# Patient Record
Sex: Female | Born: 1985 | Race: Black or African American | Hispanic: No | Marital: Single | Smoking: Former smoker
Health system: Southern US, Community
[De-identification: ages and names within clinical notes are randomized; demographics above are authoritative.]

## PROBLEM LIST (undated history)

## (undated) DIAGNOSIS — E669 Obesity, unspecified: Secondary | ICD-10-CM

## (undated) HISTORY — PX: OTHER SURGICAL HISTORY: SHX169

## (undated) HISTORY — DX: Obesity, unspecified: E66.9

---
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
## 2007-02-09 ENCOUNTER — Ambulatory Visit: Payer: Self-pay | Admitting: Family Medicine

## 2007-03-31 ENCOUNTER — Inpatient Hospital Stay (HOSPITAL_COMMUNITY): Admission: AD | Admit: 2007-03-31 | Discharge: 2007-03-31 | Payer: Self-pay | Admitting: Obstetrics & Gynecology

## 2007-04-02 ENCOUNTER — Telehealth: Payer: Self-pay | Admitting: *Deleted

## 2007-04-02 ENCOUNTER — Ambulatory Visit: Payer: Self-pay | Admitting: Family Medicine

## 2007-04-04 ENCOUNTER — Telehealth (INDEPENDENT_AMBULATORY_CARE_PROVIDER_SITE_OTHER): Payer: Self-pay | Admitting: *Deleted

## 2007-04-04 ENCOUNTER — Telehealth: Payer: Self-pay | Admitting: Family Medicine

## 2007-04-12 ENCOUNTER — Telehealth: Payer: Self-pay | Admitting: *Deleted

## 2007-04-23 ENCOUNTER — Encounter: Payer: Self-pay | Admitting: Family Medicine

## 2007-04-23 ENCOUNTER — Ambulatory Visit: Payer: Self-pay | Admitting: Sports Medicine

## 2007-04-23 LAB — CONVERTED CEMR LAB
AST: 14 units/L (ref 0–37)
Albumin: 4.3 g/dL (ref 3.5–5.2)
Alkaline Phosphatase: 87 units/L (ref 39–117)
Chlamydia, DNA Probe: NEGATIVE
MCHC: 32.4 g/dL (ref 30.0–36.0)
MCV: 89.3 fL (ref 78.0–100.0)
Potassium: 3.7 meq/L (ref 3.5–5.3)
RDW: 13.3 % (ref 11.5–15.5)
Sodium: 141 meq/L (ref 135–145)
Total Bilirubin: 0.4 mg/dL (ref 0.3–1.2)
Total Protein: 7.9 g/dL (ref 6.0–8.3)
Whiff Test: NEGATIVE

## 2007-04-25 ENCOUNTER — Telehealth: Payer: Self-pay | Admitting: Family Medicine

## 2007-04-27 ENCOUNTER — Encounter: Payer: Self-pay | Admitting: Family Medicine

## 2007-04-30 ENCOUNTER — Telehealth: Payer: Self-pay | Admitting: Family Medicine

## 2007-05-01 ENCOUNTER — Encounter: Payer: Self-pay | Admitting: Family Medicine

## 2007-05-01 ENCOUNTER — Ambulatory Visit: Payer: Self-pay | Admitting: Family Medicine

## 2007-05-01 ENCOUNTER — Other Ambulatory Visit: Admission: RE | Admit: 2007-05-01 | Discharge: 2007-05-01 | Payer: Self-pay | Admitting: Family Medicine

## 2007-05-18 ENCOUNTER — Telehealth: Payer: Self-pay | Admitting: Family Medicine

## 2007-05-21 ENCOUNTER — Telehealth: Payer: Self-pay | Admitting: Family Medicine

## 2007-05-30 ENCOUNTER — Encounter: Payer: Self-pay | Admitting: Family Medicine

## 2007-09-18 ENCOUNTER — Encounter: Payer: Self-pay | Admitting: Family Medicine

## 2008-02-20 ENCOUNTER — Telehealth (INDEPENDENT_AMBULATORY_CARE_PROVIDER_SITE_OTHER): Payer: Self-pay | Admitting: *Deleted

## 2008-02-22 ENCOUNTER — Ambulatory Visit: Payer: Self-pay | Admitting: Family Medicine

## 2008-04-14 ENCOUNTER — Ambulatory Visit: Payer: Self-pay | Admitting: Family Medicine

## 2008-05-12 ENCOUNTER — Encounter: Payer: Self-pay | Admitting: Family Medicine

## 2008-05-12 ENCOUNTER — Ambulatory Visit: Payer: Self-pay | Admitting: Family Medicine

## 2008-05-12 ENCOUNTER — Other Ambulatory Visit: Admission: RE | Admit: 2008-05-12 | Discharge: 2008-05-12 | Payer: Self-pay | Admitting: Family Medicine

## 2008-05-12 LAB — CONVERTED CEMR LAB
Beta hcg, urine, semiquantitative: NEGATIVE
Chlamydia, DNA Probe: POSITIVE — AB
GC Probe Amp, Genital: POSITIVE — AB

## 2008-05-15 ENCOUNTER — Telehealth (INDEPENDENT_AMBULATORY_CARE_PROVIDER_SITE_OTHER): Payer: Self-pay | Admitting: *Deleted

## 2008-05-16 ENCOUNTER — Ambulatory Visit: Payer: Self-pay | Admitting: Family Medicine

## 2008-06-11 ENCOUNTER — Ambulatory Visit: Payer: Self-pay | Admitting: Family Medicine

## 2008-07-08 ENCOUNTER — Ambulatory Visit: Payer: Self-pay | Admitting: Family Medicine

## 2008-07-30 ENCOUNTER — Ambulatory Visit: Payer: Self-pay | Admitting: Family Medicine

## 2008-08-01 ENCOUNTER — Encounter: Payer: Self-pay | Admitting: Family Medicine

## 2008-08-01 ENCOUNTER — Ambulatory Visit: Payer: Self-pay | Admitting: Family Medicine

## 2008-08-01 LAB — CONVERTED CEMR LAB
BUN: 10 mg/dL (ref 6–23)
Chloride: 103 meq/L (ref 96–112)
Creatinine, Ser: 0.83 mg/dL (ref 0.40–1.20)
Potassium: 3.8 meq/L (ref 3.5–5.3)

## 2008-08-04 ENCOUNTER — Encounter: Payer: Self-pay | Admitting: Family Medicine

## 2008-09-08 ENCOUNTER — Encounter (INDEPENDENT_AMBULATORY_CARE_PROVIDER_SITE_OTHER): Payer: Self-pay | Admitting: *Deleted

## 2008-09-08 DIAGNOSIS — F17200 Nicotine dependence, unspecified, uncomplicated: Secondary | ICD-10-CM

## 2008-10-15 ENCOUNTER — Ambulatory Visit: Payer: Self-pay | Admitting: Family Medicine

## 2008-12-31 ENCOUNTER — Ambulatory Visit: Payer: Self-pay | Admitting: Family Medicine

## 2009-03-18 ENCOUNTER — Ambulatory Visit: Payer: Self-pay | Admitting: Family Medicine

## 2009-03-19 ENCOUNTER — Encounter: Payer: Self-pay | Admitting: Family Medicine

## 2009-03-20 ENCOUNTER — Encounter: Payer: Self-pay | Admitting: Family Medicine

## 2009-03-24 ENCOUNTER — Telehealth: Payer: Self-pay | Admitting: Family Medicine

## 2009-04-15 ENCOUNTER — Telehealth: Payer: Self-pay | Admitting: Family Medicine

## 2009-04-16 ENCOUNTER — Ambulatory Visit: Payer: Self-pay | Admitting: Family Medicine

## 2009-04-20 ENCOUNTER — Encounter: Payer: Self-pay | Admitting: *Deleted

## 2009-04-22 ENCOUNTER — Ambulatory Visit: Payer: Self-pay | Admitting: Family Medicine

## 2009-04-22 DIAGNOSIS — M2140 Flat foot [pes planus] (acquired), unspecified foot: Secondary | ICD-10-CM | POA: Insufficient documentation

## 2009-05-18 ENCOUNTER — Telehealth: Payer: Self-pay | Admitting: Family Medicine

## 2009-05-27 ENCOUNTER — Telehealth: Payer: Self-pay | Admitting: Family Medicine

## 2009-06-03 ENCOUNTER — Ambulatory Visit: Payer: Self-pay | Admitting: Family Medicine

## 2009-06-17 ENCOUNTER — Encounter: Payer: Self-pay | Admitting: Family Medicine

## 2009-06-17 ENCOUNTER — Ambulatory Visit: Payer: Self-pay | Admitting: Family Medicine

## 2009-06-17 ENCOUNTER — Other Ambulatory Visit: Admission: RE | Admit: 2009-06-17 | Discharge: 2009-06-17 | Payer: Self-pay | Admitting: *Deleted

## 2009-06-17 LAB — CONVERTED CEMR LAB
Chlamydia, DNA Probe: POSITIVE — AB
Hep B S Ab: POSITIVE — AB
Pap Smear: NEGATIVE

## 2009-06-19 ENCOUNTER — Telehealth: Payer: Self-pay | Admitting: *Deleted

## 2009-06-19 ENCOUNTER — Encounter: Payer: Self-pay | Admitting: Family Medicine

## 2009-06-22 ENCOUNTER — Ambulatory Visit: Payer: Self-pay | Admitting: Family Medicine

## 2009-08-19 ENCOUNTER — Ambulatory Visit: Payer: Self-pay | Admitting: Family Medicine

## 2009-11-04 ENCOUNTER — Ambulatory Visit: Payer: Self-pay | Admitting: Family Medicine

## 2009-11-04 ENCOUNTER — Encounter: Payer: Self-pay | Admitting: Sports Medicine

## 2010-01-20 ENCOUNTER — Ambulatory Visit: Admission: RE | Admit: 2010-01-20 | Discharge: 2010-01-20 | Payer: Self-pay | Source: Home / Self Care

## 2010-02-02 NOTE — Progress Notes (Signed)
----   Converted from flag ---- ---- 06/19/2009 8:37 AM, Ardeen Garland  MD wrote: Can you inform patient of positive STDs and schedule in RN clinic for treatment.  Will put in order for 1 g azithro slurry by mouth and 250mg  IM rocephin. Thanks ------------------------------       Additional Follow-up for Phone Call Additional follow up Details #2::    [phone]  can be reached on this number. Follow-up by: Clydell Hakim,  June 19, 2009 4:05 PM  called and left message at home number listed and also on cell # [phone] to call back. Theresia Lo RN  June 19, 2009 3:23 PM  pt returned call - call [phone] De Nurse  June 19, 2009 4:40 PM  spoke with patient and advised her of test results. appointment scheduled for Monday 06/20/11for STD treatment. Theresia Lo RN  June 19, 2009 5:05 PM

## 2010-02-02 NOTE — Assessment & Plan Note (Signed)
Summary: depo inj,tcb  Nurse Visit   Allergies: No Known Drug Allergies  Medication Administration  Injection # 1:    Medication: Depo-Provera 150mg     Diagnosis: CONTRACEPTIVE MANAGEMENT (ICD-V25.09)    Route: IM    Site: LUOQ gluteus    Exp Date: 05/2011    Lot #: [id number]    Mfr: [email or web address]    Comments: next depo due June 1 thru June 17, 2009    Patient tolerated injection without complications    Given by: Theresia Lo RN (March 18, 2009 1:43 PM)  Orders Added: 1)  Depo-Provera 150mg  [J1055] 2)  Admin of Injection (IM/SQ) [[id number]]   Medication Administration  Injection # 1:    Medication: Depo-Provera 150mg     Diagnosis: CONTRACEPTIVE MANAGEMENT (ICD-V25.09)    Route: IM    Site: LUOQ gluteus    Exp Date: 05/2011    Lot #: [id number]    Mfr: [email or web address]    Comments: next depo due June 1 thru June 17, 2009    Patient tolerated injection without complications    Given by: Theresia Lo RN (March 18, 2009 1:43 PM)  Orders Added: 1)  Depo-Provera 150mg  [J1055] 2)  Admin of Injection (IM/SQ) [84166]

## 2010-02-02 NOTE — Miscellaneous (Signed)
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

## 2010-02-02 NOTE — Assessment & Plan Note (Signed)
Summary: breaking out on legs,tcb   Vital Signs:  Patient profile:   25 year old female Height:      64.75 inches Weight:      221 pounds Temp:     98.3 degrees F oral Pulse rate:   85 / minute BP sitting:   127 / 83  (left arm) Cuff size:   large  Vitals Entered By: San Morelle, SMA CC: Pt is breaking out on both of her ankles Is Patient Diabetic? No Pain Assessment Patient in pain? yes     Location: ankle Intensity: 3   Primary Care Provider:  Ardeen Garland  MD  CC:  Pt is breaking out on both of her ankles.  History of Present Illness: 25 year old AAF:  1. Rash: x a few weeks, both ankles, itchy, no other rashes, denies fever/chills, N/V/D, HA, open lesions.  Habits & Providers  Alcohol-Tobacco-Diet     Tobacco Status: never  Current Medications (verified): 1)  Ala Cort 1 % Crea (Hydrocortisone) .... Apply To Aa Two Times A Day As Needed Itch 2)  Sarna 0.5-0.5 % Lotn (Camphor-Menthol) .... Apply To Skin Two Times A Day 3)  Ketoconazole 2 % Crea (Ketoconazole) .... Apply To Affected Area Daily Until Gone  Allergies (verified): No Known Drug Allergies PMH-FH-SH reviewed for relevance  Social History: Smoking Status:  never  Review of Systems General:  Denies chills and fever. Derm:  Complains of itching, lesion(s), and rash.  Physical Exam  General:  Obese, alert, NAD, cooperative to examination. Vitals reviewed. Skin:  Left lateral malleolus with 1 inch diameter round plaque with pink raised edge and scaley center. Right lateral malleolus with 2 similar lesions, both smaller. KOH negative. Psych:  Memory intact for recent and remote and good eye contact. Child-like.   Impression & Recommendations:  Problem # 1:  TINEA CORPORIS (ICD-110.5) Assessment New Rx Ketoconazole with instructions to follow up with PCP in 4-6 weeks if not improving.  Orders: KOH-[facility] (04540) [facility]- Est Level  3 (99213)  Complete Medication List: 1)  Ala Cort 1 % Crea  (Hydrocortisone) .... Apply to aa two times a day as needed itch 2)  Sarna 0.5-0.5 % Lotn (Camphor-menthol) .... Apply to skin two times a day 3)  Ketoconazole 2 % Crea (Ketoconazole) .... Apply to affected area daily until gone  Patient Instructions: 1)  It was nice to see you today! 2)  Use the ketoconazole on your ankle. If it is not improving in 4-6 weeks, then let us know. Prescriptions: KETOCONAZOLE 2 % CREA (KETOCONAZOLE) apply to affected area daily until gone  #1 large tube x 3   Entered and Authorized by:   Helane Rima DO   Signed by:   Helane Rima DO on 04/16/2009   Method used:   Electronically to        [facility]  [address] [id number] ([facility])       [address]       [place], [place]  [address]       Ph: [phone] or [phone]       Fax: [phone]   RxID:   [id number]    Laboratory Results  Date/Time Received: April 16, 2009 10:32 AM  Date/Time Reported: April 16, 2009 10:42 AM   Other Tests  Skin KOH: Negative Comments: ...............test performed by......Marland KitchenBonnie A. [place], MLS (ASCP)cm

## 2010-02-02 NOTE — Assessment & Plan Note (Signed)
Summary: painful left ankle/ls   Vital Signs:  Patient profile:   25 year old female Height:      64.75 inches Weight:      222.9 pounds BMI:     37.51 Temp:     98.7 degrees F Pulse rate:   101 / minute BP sitting:   131 / 88  (left arm)  Vitals Entered By: Theresia Lo RN (October 15, 2008 10:40 AM) CC: painful left ankle for 5 days Is Patient Diabetic? No Pain Assessment Patient in pain? yes     Location: left ankle Intensity: 10 Type: sharp   Primary Care Provider:  Ardeen Garland  MD  CC:  painful left ankle for 5 days.  History of Present Illness: 25 year old obese AAF with:  1. Ankle Pain: left ankle, x 5 days, first noticed when she was getting into her mother's care after school ([facility]), pain posterior-lateral ankle, no injury noted, no bruising or swelling. the pain is worse with walking and better when off of her feet. patient reports PMHx of chronic bilateral foot pain for which she has seen a podiatrist in the past and was prescribed orthotics. she also states that she was supposed to have surgery to her ankles after high school but never had it done. Paper chart reviewed: patient referred to [facility]. She was also seen at [facility]: severe pronation of both mid and hind foot bilaterally, total collapse of transverse arch, possible collapse of spring ligament bilaterally. referral to foot surgeon for these issues.  Current Medications (verified): 1)  Ala Cort 1 % Crea (Hydrocortisone) .... Apply To Aa Two Times A Day As Needed Itch 2)  Sarna 0.5-0.5 % Lotn (Camphor-Menthol) .... Apply To Skin Two Times A Day 3)  Cephalexin 250 Mg/76ml Susr (Cephalexin) .Marland Kitchen.. 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 MS:  Complains of joint pain; denies  joint redness, joint swelling, loss of strength, and stiffness. Derm:  Denies changes in color of skin, lesion(s), and rash. Neuro:  Denies falling down, numbness, tingling, and weakness.  Physical Exam  General:  obese, alert, NAD, cooperative to examination. vitals reviewed. Msk:  left ankle: no edema, no erythema, no warmth, FROM, no laxity. collapse of arch with standing and severe pronation with walking. able to bear weight on ankle. 2+ dp. Psych:  memory intact for recent and remote and good eye contact. Child-like.   Impression & Recommendations:  Problem # 1:  ANKLE PAIN, LEFT (ICD-719.47) Assessment Deteriorated  Flare of ankle pain in patient with chronic issue. No red flags today so recommended: ankle brace, continue to wear orthotics, Ibuprofen for pain, follow up with podiatrist (phone number given). The patient has mild mental retardation and presented alone today. While she struggled, she did do a good job of giving an accurate history of her ankle issues. Instructed patient to give her mother the patient instruction sheet with recommendations on it.  Orders: [facility]- Est Level  3 (99213)  Complete Medication List: 1)  Ala Cort 1 % Crea (Hydrocortisone) .... Apply to aa two times a day as needed itch 2)  Sarna 0.5-0.5 % Lotn (Camphor-menthol) .... Apply to skin two times a day 3)  Cephalexin 250 Mg/60ml Susr (Cephalexin) .Marland Kitchen.. 10 ml by mouth  two times a day for 10 days disp: qs  Other Orders: Influenza Vaccine NON MCR (03474) Depo-Provera 150mg  (Q5956)  Patient Instructions: 1)  It was nice to meet you today! 2)  For your ankle pain, please continue using the brace, continue to use you specially-made shoes, and take Ibuprofen for pain. Please make a follow up appointment with your foot specialist.   Influenza Vaccine    Vaccine Type: Fluvax Non-MCR    Site: left deltoid    Mfr: [email or web address]    Dose: 0.5 ml    Route: IM    Given by: Theresia Lo RN    Exp. Date:  07/02/2009    Lot #: [id number]    VIS given: 08/12/2008  Flu Vaccine Consent Questions    Do you have a history of severe allergic reactions to this vaccine? no    Any prior history of allergic reactions to egg and/or gelatin? no    Do you have a sensitivity to the preservative Thimersol? no    Do you have a past history of Guillan-Barre Syndrome? no    Do you currently have an acute febrile illness? no    Have you ever had a severe reaction to latex? no    Vaccine information given and explained to patient? yes    Are you currently pregnant? no     Medication Administration  Injection # 1:    Medication: Depo-Provera 150mg     Diagnosis: CONTRACEPTIVE MANAGEMENT (ICD-V25.09)    Route: IM    Site: R deltoid    Exp Date: 03/2011    Lot #: [id number]    Mfr: [email or web address]    Comments: next depo due 12/31/2008  thru 01/14/2009    Patient tolerated injection without complications    Given by: Theresia Lo RN (October 15, 2008 10:43 AM)  Orders Added: 1)  Influenza Vaccine NON MCR [00028] 2)  Depo-Provera 150mg  [J1055] 3)  [facility]- Est Level  3 [66063]

## 2010-02-02 NOTE — Progress Notes (Signed)
Summary: Ref Req  Phone Note Call from Patient Call back at [phone]   Caller: Patient Summary of Call: Needs referral to foot doctor [facility] [phone] would like to go Tues the 26th they have an opening. Initial call taken by: Clydell Hakim,  April 15, 2009 11:29 AM  Follow-up for Phone Call        will forward to MD. patient is having problems with pain in both ankles.  states she was suppose to have surgery after she graduated from high school but she never had it. explained that Dr. Georgiana Shore may require she come to see her first before making referral , but will forward message to her. Follow-up by: Theresia Lo RN,  April 15, 2009 12:30 PM  Additional Follow-up for Phone Call Additional follow up Details #1::        She needs appt.  She no showed last appt about the letter she needed as well.   Additional Follow-up by: Lamar Laundry, MD April 18th, 2011    Additional Follow-up for Phone Call Additional follow up Details #2::    message left on voicemail regarding need for appointment before referral can be given.  Follow-up by: Theresia Lo RN,  April 20, 2009 9:59 AM

## 2010-02-02 NOTE — Assessment & Plan Note (Signed)
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)    Dose: 1 gram    Route: po    Exp Date: 03/03/2009    Lot #: [id number]    Mfr: [email or web address]    Patient tolerated medication without complications    Given by: Theresia Lo RN (June 22, 2009 9:36 AM)  Orders Added: 1)  Rocephin  250mg  [J0696] 2)  Azithromycin oral [Q0144] 3)  Admin of Injection (IM/SQ) [62703]   Medication Administration  Injection # 1:    Medication: Rocephin  250mg     Diagnosis: GONOCOCCAL CERVICITIS (ICD-098.15)    Route: IM    Site: LUOQ gluteus    Exp Date: 12/2010    Lot #: [id number]    Mfr: sandoz    Patient tolerated injection without complications    Given by: Theresia Lo RN (June 22, 2009 9:08 AM)  Medication # 1:    Medication: Azithromycin oral    Diagnosis: 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 advised to abstain from sex for 7 days and then to always use condoms to prevent STD. advised to advise partner to be treated. Theresia Lo RN  June 22, 2009 9:38 AM   Appended Document: STD treatment/ls Communicable  Disease Report faxed to [facility]. Health Depart.  [phone]. Terese Door  June 22, 2009 3:58 PM

## 2010-02-02 NOTE — Progress Notes (Signed)
Summary: needs another note  Phone Note Call from Patient Call back at [phone]   Caller: Patient Summary of Call: needs another note - needs to explain why she needs to be her own payee Initial call taken by: De Nurse,  March 24, 2009 9:33 AM  Follow-up for Phone Call        needs appointment to discuss this further.  Follow-up by: Lamar Laundry, MD March 22nd, 2011 7:50PM  Additional Follow-up for Phone Call Additional follow up Details #1::        lvm for pt to make appt. Additional Follow-up by: De Nurse,  March 26, 2009 2:44 PM

## 2010-02-02 NOTE — Assessment & Plan Note (Signed)
Summary: depo,tcb  Nurse Visit   Allergies: No Known Drug Allergies  Medication Administration  Injection # 1:    Medication: Depo-Provera 150mg     Diagnosis: CONTRACEPTIVE MANAGEMENT (ICD-V25.09)    Route: IM    Site: LUOQ gluteus    Exp Date: 03/2012    Lot #: [id number]    Mfr: [email or web address]    Comments: next depo due Nov 2 thru Nov 18, 2009    Patient tolerated injection without complications    Given by: Theresia Lo RN (August 19, 2009 11:39 AM)  Orders Added: 1)  Depo-Provera 150mg  [J1055] 2)  Admin of Injection (IM/SQ) [40102]   Medication Administration  Injection # 1:    Medication: Depo-Provera 150mg     Diagnosis: CONTRACEPTIVE MANAGEMENT (ICD-V25.09)    Route: IM    Site: LUOQ gluteus    Exp Date: 03/2012    Lot #: [id number]    Mfr: [email or web address]    Comments: next depo due Nov 2 thru Nov 18, 2009    Patient tolerated injection without complications    Given by: Theresia Lo RN (August 19, 2009 11:39 AM)  Orders Added: 1)  Depo-Provera 150mg  [J1055] 2)  Admin of Injection (IM/SQ) [44034]

## 2010-02-02 NOTE — Progress Notes (Signed)
Summary: phn msg  Phone Note Call from Patient Call back at [phone]   Caller: Patient Summary of Call: has a question  Initial call taken by: De Nurse,  May 18, 2009 11:08 AM  Follow-up for Phone Call        wants birth control pills. appt with pcp this wed Follow-up by: Golden Circle RN,  May 18, 2009 11:11 AM

## 2010-02-02 NOTE — Letter (Signed)
Summary: Handout Printed  Printed Handout:  - Scabies, Treatment of 

## 2010-02-02 NOTE — Miscellaneous (Signed)
Summary: Note to SS  Clinical Lists Changes Ms. Jezek requests a note or statement saying that she is able to handler her own financial affairs.  This has to go to [facility], so they will reissue her funds directly to her.  See requests in  your box Abundio Miu  March 19, 2009 3:16 PM  completed.  Ardeen Garland  MD  March 20, 2009 1:56 PM

## 2010-02-02 NOTE — Letter (Signed)
Summary: Generic Letter  [facility]  [address]   [place], [place] [address]   Phone: [phone]  Fax: [phone]    03/20/2009  Kelli Hurst [address] [place], [place]  [address]  To Whom it May Concern,  Kelli Hurst is capable of handling her own finances.  If you have any further questions or need further paperwork completed, please contact our office.          Sincerely,    Ardeen Garland  MD
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
## 2010-02-02 NOTE — Progress Notes (Signed)
Summary: resch  Phone Note Call from Patient   Caller: Patient Summary of Call: can't find a ride today so had to resch appt. Initial call taken by: De Nurse,  May 27, 2009 11:33 AM

## 2010-02-02 NOTE — Assessment & Plan Note (Signed)
Summary: wants OCP/[place]   Vital Signs:  Patient profile:   25 year old female Height:      64.75 inches Weight:      214 pounds BMI:     36.02 Temp:     98.7 degrees F oral Pulse rate:   99 / minute BP sitting:   112 / 81  (right arm) Cuff size:   large  Vitals Entered By: Tessie Fass CMA (June 03, 2009 2:38 PM) CC: arm broken out Is Patient Diabetic? No Pain Assessment Patient in pain? no        Primary Care Provider:  Ardeen Garland  MD  CC:  arm broken out.  History of Present Illness: Giliana comes in for depo shot and for "bumps on her arms" 1) arm rash - going on for over a month.  Rarely at home per her mom.  Always outside per [facility].  Sometimes sleeping at other peoples' houses.  gets red, itchy bumps. She scratches them alot and they go down and darken.  On exposed areas of arms and legs and chest.  Not on abdomen, back, feet, face. Primarily bothered by the itching.  They do have 2 dogs but state they use flea preventitive.  Can't say about dogs at other people's houses, particularly her aunt's house.  2) due for depo.  Also due for pap but did not schedule this today.   Habits & Providers  Alcohol-Tobacco-Diet     Tobacco Status: never  Allergies: No Known Drug Allergies  Social History: Smoking Status:  never  Physical Exam  General:  obese, alert, NAD vitals reviewed Eyes:  pupils equal, pupils round, corneas and lenses clear, and no injection.   Skin:  various stages of rash across arms, shins, chest.  red bump with surrounding welts/area of erythema.  some darker purple areas.  evidence of excoriation.  none on feet or in between fingers.    Impression & Recommendations:  Problem # 1:  INSECT BITE (ICD-919.4) Assessment New  Appears like insect bites, possibly fleas.  Does not appear like scabies.  Advised avoidance of sleepign at strange places or places with pets that aren't hers.  WAsh their dogs and treat with flea preventiive to ensure not  from them. hydroxyzine and traimcinolone for the itching.  Will return if any of the lesions begin to look infected.   Orders: [facility]- Est Level  3 (16109)  Problem # 2:  CONTRACEPTIVE MANAGEMENT (ICD-V25.09) Assessment: Unchanged Advised she needs to schedule pap prior to next depo shot.  She wanted to know if it could be same day as next depo.  Told her that would be fine.  Orders: Depo-Provera 150mg  (J1055) [facility]- Est Level  3 (99213)  Complete Medication List: 1)  Ala Cort 1 % Crea (Hydrocortisone) .... Apply to aa two times a day as needed itch 2)  Sarna 0.5-0.5 % Lotn (Camphor-menthol) .... Apply to skin two times a day 3)  Ketoconazole 2 % Crea (Ketoconazole) .... Apply to affected area daily until gone 4)  Hydroxyzine Hcl 25 Mg Tabs (Hydroxyzine hcl) .Marland Kitchen.. 1 tab by mouth q 6 hrs as needed itching 5)  Triamcinolone Acetonide 0.5 % Crea (Triamcinolone acetonide) .... Apply to rash  two times a day as needed for itching disp: 60g  Patient Instructions: 1)  Be sure to schedule your pap smear before (or at the same time as) your next depo shot! 2)  Avoid anywhere where you could be bitten by fleas! 3)  If any spots begin to look infected, come right back in.  Prescriptions: TRIAMCINOLONE ACETONIDE 0.5 % CREA (TRIAMCINOLONE ACETONIDE) apply to rash  two times a day as needed for itching disp: 60g  #1 x 3   Entered and Authorized by:   Ardeen Garland  MD   Signed by:   Ardeen Garland  MD on 06/03/2009   Method used:   Print then Give to Patient   RxID:   [id number] HYDROXYZINE HCL 25 MG TABS (HYDROXYZINE HCL) 1 tab by mouth q 6 hrs as needed itching  #30 x 1   Entered and Authorized by:   Ardeen Garland  MD   Signed by:   Ardeen Garland  MD on 06/03/2009   Method used:   Print then Give to Patient   RxID:   [id number]    Medication Administration  Injection # 1:    Medication: Depo-Provera 150mg     Diagnosis: CONTRACEPTIVE MANAGEMENT (ICD-V25.09)    Route: IM    Site: R  deltoid    Exp Date: 04/2011    Lot #: [id number]    Mfr: Pharmacia    Comments: NEXT DEPO DUE 8/17 THROUGH 8/31    Patient tolerated injection without complications    Given by: Tessie Fass CMA (June 03, 2009 3:15 PM)  Orders Added: 1)  Depo-Provera 150mg  [J1055] 2)  [facility]- Est Level  3 [72536]

## 2010-02-02 NOTE — Miscellaneous (Signed)
Summary: pt needs appt/ts  spoke with pt and advised to sched. appt with PCP re: ankle.Rolan Bucco agreed.Arlyss Repress CMA,  April 20, 2009 10:07 AM

## 2010-02-02 NOTE — Assessment & Plan Note (Signed)
Summary: mom want pt seen for "bugs" and to have depo/[facility]   Vital Signs:  Patient profile:   25 year old female Weight:      231 pounds BMI:     38.58 Temp:     97.8 degrees F oral Pulse rate:   99 / minute BP sitting:   130 / 82  (left arm) Cuff size:   large  Vitals Entered By: Jimmy Footman, CMA (November 04, 2009 10:34 AM) CC: Rash x1 month, Depo shot, Flu shot Is Patient Diabetic? No Pain Assessment Patient in pain? no        Primary Care Provider:  Demetria Pore MD  CC:  Rash x1 month, Depo shot, and Flu shot.  History of Present Illness: 25 yo female with rash.  Rash:  Multiple small red bumps present on both arms, hands.  Very itchy, present 1 month now.  Father may have had similary symptoms.  They think they may have seen small bugs on her.  Nothing makes it better other than scratching.  Keeps her up at times.  No rash present on other parts of body.    Also wants depo and flu shot.  Current Medications (verified): 1)  Depo-Provera 150 Mg/ml Susp (Medroxyprogesterone Acetate) 2)  Permethrin 5 % Crea (Permethrin) .... Apply Neck To Feet At Night, Wash Off in Am, May Retreat If Still Itching in 1-2 Weeks. 3)  Hydroxyzine Hcl 50 Mg Tabs (Hydroxyzine Hcl) .... One Tab By Mouth Qhs As Needed For Itching.  Allergies (verified): No Known Drug Allergies  Review of Systems       See HPI  Physical Exam  General:  Well-developed,well-nourished,in no acute distress; alert,appropriate and cooperative throughout examination Skin:  Multiple small 1mm red papules with surrounding erythema and excoriations present on arms and hands.  None on torso, legs, abdomen, back.  No signs bacterial superinfection.     Impression & Recommendations:  Problem # 1:  SCABIES (ICD-133.0) Assessment New Symptoms suggestive of scabies. They should contact exterminator, wash all linens in hot water, cover mattresses with plastic zippable covers.   Handout given documenting home care  instructions. After all linens/bedding treated, permethrin 5% topical neck to feet, may retreat x1 if itching persists >1-2 wks. Hydroxyzine as needed. RTC as needed.  Orders: [facility]- Est  Level 4 (16109)  Problem # 2:  CONTRACEPTIVE MANAGEMENT (ICD-V25.09) Assessment: Unchanged Depo given.  Orders: Depo-Provera 150mg  (J1055) Admin of Therapeutic Inj  intramuscular or subcutaneous (60454) [facility]- Est  Level 4 (09811)  Problem # 3:  Preventive Health Care (ICD-V70.0) Assessment: Comment Only Flu shot given.  Complete Medication List: 1)  Depo-provera 150 Mg/ml Susp (Medroxyprogesterone acetate) 2)  Permethrin 5 % Crea (Permethrin) .... Apply neck to feet at night, wash off in am, may retreat if still itching in 1-2 weeks. 3)  Hydroxyzine Hcl 50 Mg Tabs (Hydroxyzine hcl) .... One tab by mouth qhs as needed for itching.  Other Orders: Flu Vaccine 35yrs + (91478) Admin 1st Vaccine (29562)  Patient Instructions: 1)  Great to meet you, 2)  It looks like you may have scabies. 3)  Call an exterminator and have them treat your house for mites. 4)  You may want to get a plastic mattress wrap and zip up your mattress in this. 5)  Use the cream as directed, you may also use the itching medicine at night. 6)  Come back if no better in 2 weeks. 7)  -Dr. Karie Schwalbe. Prescriptions: HYDROXYZINE HCL 50  MG TABS (HYDROXYZINE HCL) One tab by mouth qHS as needed for itching.  #30 x 0   Entered and Authorized by:   Rodney Langton MD   Signed by:   Rodney Langton MD on 11/04/2009   Method used:   Print then Give to Patient   RxID:   [id number] PERMETHRIN 5 % CREA (PERMETHRIN) Apply neck to feet at night, wash off in AM, may retreat if still itching in 1-2 weeks.  #60gm tube x 0   Entered and Authorized by:   Rodney Langton MD   Signed by:   Rodney Langton MD on 11/04/2009   Method used:   Print then Give to Patient   RxID:   [id number]    Medication  Administration  Injection # 1:    Medication: Depo-Provera 150mg     Diagnosis: CONTRACEPTIVE MANAGEMENT (ICD-V25.09)    Route: IM    Site: LUOQ gluteus    Exp Date: 12/04/2011    Lot #: [id number]    Mfr: Francisca December    Patient tolerated injection without complications    Given by: Jimmy Footman, CMA (November 04, 2009 11:30 AM)  Orders Added: 1)  Flu Vaccine 58yrs + [06269] 2)  Admin 1st Vaccine [48546] 3)  Depo-Provera 150mg  [J1055] 4)  Admin of Therapeutic Inj  intramuscular or subcutaneous [96372] 5)  [facility]- Est  Level 4 [27035]   Immunizations Administered:  Influenza Vaccine # 1:    Vaccine Type: Fluvax 3+    Site: right deltoid    Mfr: GlaxoSmithKline    Dose: 0.5 ml    Route: IM    Given by: Jimmy Footman, CMA    Exp. Date: 06/30/2010    Lot #: [id number]    VIS given: 07/28/09 version given November 04, 2009.  Flu Vaccine Consent Questions:    Do you have a history of severe allergic reactions to this vaccine? no    Any prior history of allergic reactions to egg and/or gelatin? no    Do you have a sensitivity to the preservative Thimersol? no    Do you have a past history of Guillan-Barre Syndrome? no    Do you currently have an acute febrile illness? no    Have you ever had a severe reaction to latex? no    Vaccine information given and explained to patient? yes    Are you currently pregnant? no   Immunizations Administered:  Influenza Vaccine # 1:    Vaccine Type: Fluvax 3+    Site: right deltoid    Mfr: GlaxoSmithKline    Dose: 0.5 ml    Route: IM    Given by: Jimmy Footman, CMA    Exp. Date: 06/30/2010    Lot #: [id number]    VIS given: 07/28/09 version given November 04, 2009.

## 2010-02-02 NOTE — Assessment & Plan Note (Signed)
Summary: ankles,tcb   Vital Signs:  Patient profile:   25 year old female Weight:      220 pounds Pulse rate:   78 / minute BP sitting:   127 / 84  (right arm)  Vitals Entered By: Arlyss Repress CMA, (April 22, 2009 9:30 AM) CC: heel pain x 2 days.. pain with walking... Is Patient Diabetic? No Pain Assessment Patient in pain? yes     Location: heel Intensity: 4 Onset of pain  x2d   Primary Care Provider:  Ardeen Garland  MD  CC:  heel pain x 2 days.. pain with walking....  History of Present Illness: Bradlee has mild MR and comes in with her mother today. Zulema comes in today for bilateral heel pain.  Off and on for weeks to months but worse the last 2 days. On the plantar surface of her heels.  Worse when she up walking on them, better when not bearing weight.  Mom gave her heel cups she bought at the store but mom says she lost them, Dashanti says they didn't help.  Has old orthotics made by [facility] care over 3 years ago.  Tawnie says they aren't helping either.  Wears a variety of different shoes.    Habits & Providers  Alcohol-Tobacco-Diet     Tobacco Status: quit  Allergies: No Known Drug Allergies  Social History: Smoking Status:  quit  Physical Exam  General:  obese, alert, NAD vitals reviewed Extremities:  Pes planus bilaterally. No tenderness to palpation of heel or plantar surface of the foot. FROM of bilateral ankles.  Neurologic:  Normal gait   Impression & Recommendations:  Problem # 1:  PES PLANUS (ICD-734) Assessment New  Foot pain likely coming from flat feet in combo with poor foot wear and worn out orthotics.  She would like to go back to [facility].  She tried to call but they told her she needed a new referral.  Will put in new referral.   Orders: Podiatry Referral (Podiatry) [facility]- Est  Level 4 (16109)  Complete Medication List: 1)  Ala Cort 1 % Crea (Hydrocortisone) .... Apply to aa two times a day as needed itch 2)  Sarna  0.5-0.5 % Lotn (Camphor-menthol) .... Apply to skin two times a day 3)  Ketoconazole 2 % Crea (Ketoconazole) .... Apply to affected area daily until gone

## 2010-02-04 NOTE — Assessment & Plan Note (Signed)
Summary: DEPO/[facility]  Nurse Visit   Allergies: No Known Drug Allergies  Medication Administration  Injection # 1:    Medication: Depo-Provera 150mg     Diagnosis: CONTRACEPTIVE MANAGEMENT (ICD-V25.09)    Route: IM    Site: LUOQ gluteus    Exp Date: 05/2012    Lot #: [id number]    Mfr: [email or web address]    Comments: next depo due April 5 thru April 22, 2010    Patient tolerated injection without complications    Given by: Theresia Lo RN (January 20, 2010 4:23 PM)  Orders Added: 1)  Depo-Provera 150mg  [J1055] 2)  Admin of Injection (IM/SQ) [04540]   Medication Administration  Injection # 1:    Medication: Depo-Provera 150mg     Diagnosis: CONTRACEPTIVE MANAGEMENT (ICD-V25.09)    Route: IM    Site: LUOQ gluteus    Exp Date: 05/2012    Lot #: [id number]    Mfr: greenstone    Comments: next depo due April 5 thru April 22, 2010    Patient tolerated injection without complications    Given by: Theresia Lo RN (January 20, 2010 4:23 PM)  Orders Added: 1)  Depo-Provera 150mg  [J1055] 2)  Admin of Injection (IM/SQ) [47829]

## 2010-02-18 ENCOUNTER — Encounter: Payer: Self-pay | Admitting: *Deleted

## 2010-04-08 ENCOUNTER — Ambulatory Visit (INDEPENDENT_AMBULATORY_CARE_PROVIDER_SITE_OTHER): Payer: Medicaid Other | Admitting: *Deleted

## 2010-04-08 DIAGNOSIS — Z309 Encounter for contraceptive management, unspecified: Secondary | ICD-10-CM

## 2010-04-08 MED ORDER — MEDROXYPROGESTERONE ACETATE 150 MG/ML IM SUSP: 150.0000 mg | INTRAMUSCULAR | Status: DC

## 2010-04-08 MED ORDER — MEDROXYPROGESTERONE ACETATE 150 MG/ML IM SUSP
150.0000 mg | Freq: Once | INTRAMUSCULAR | Status: AC
  Administered 2010-04-08: 150 mg via INTRAMUSCULAR

## 2010-06-24 ENCOUNTER — Ambulatory Visit: Payer: Medicaid Other

## 2010-07-08 ENCOUNTER — Ambulatory Visit (INDEPENDENT_AMBULATORY_CARE_PROVIDER_SITE_OTHER): Payer: Medicaid Other | Admitting: *Deleted

## 2010-07-08 DIAGNOSIS — Z309 Encounter for contraceptive management, unspecified: Secondary | ICD-10-CM

## 2010-07-08 MED ORDER — MEDROXYPROGESTERONE ACETATE 150 MG/ML IM SUSP
150.0000 mg | Freq: Once | INTRAMUSCULAR | Status: AC
Start: 2010-07-08 — End: 2010-07-08
  Administered 2010-07-08: 150 mg via INTRAMUSCULAR

## 2010-07-30 ENCOUNTER — Other Ambulatory Visit (HOSPITAL_COMMUNITY)
Admission: RE | Admit: 2010-07-30 | Discharge: 2010-07-30 | Disposition: A | Discharge status: Home / Self Care | Payer: Medicaid Other | Source: Ambulatory Visit | Attending: Family Medicine | Admitting: Family Medicine

## 2010-07-30 ENCOUNTER — Encounter: Payer: Self-pay | Admitting: Family Medicine

## 2010-07-30 ENCOUNTER — Ambulatory Visit (INDEPENDENT_AMBULATORY_CARE_PROVIDER_SITE_OTHER): Payer: Medicaid Other | Admitting: Family Medicine

## 2010-07-30 DIAGNOSIS — Z01419 Encounter for gynecological examination (general) (routine) without abnormal findings: Secondary | ICD-10-CM

## 2010-07-30 DIAGNOSIS — N760 Acute vaginitis: Secondary | ICD-10-CM

## 2010-07-30 DIAGNOSIS — Z124 Encounter for screening for malignant neoplasm of cervix: Secondary | ICD-10-CM

## 2010-07-30 DIAGNOSIS — Z0000 Encounter for general adult medical examination without abnormal findings: Secondary | ICD-10-CM

## 2010-07-30 DIAGNOSIS — E669 Obesity, unspecified: Secondary | ICD-10-CM

## 2010-07-30 LAB — BASIC METABOLIC PANEL
CO2: 22 mEq/L (ref 19–32)
Chloride: 101 mEq/L (ref 96–112)
Creat: 0.71 mg/dL (ref 0.50–1.10)
Potassium: 3.5 mEq/L (ref 3.5–5.3)

## 2010-07-30 LAB — CBC
HCT: 37.3 % (ref 36.0–46.0)
Hemoglobin: 12.5 g/dL (ref 12.0–15.0)
MCH: 28.9 pg (ref 26.0–34.0)
MCHC: 33.5 g/dL (ref 30.0–36.0)
RBC: 4.33 MIL/uL (ref 3.87–5.11)

## 2010-07-30 LAB — TSH: TSH: 0.676 u[IU]/mL (ref 0.350–4.500)

## 2010-07-30 NOTE — Assessment & Plan Note (Signed)
PAP done today.  No problems in general, only medical problem appears to be obesity.  Will continue counseling pt on this.  Pt would like to become pregnant in the future, currently on depo. Does not drink or do drug; quit tob use a few months ago.

## 2010-07-30 NOTE — Progress Notes (Signed)
  Subjective:     Kelli Hurst is a 25 y.o. female and is here for a comprehensive physical exam. The patient reports no problems.  History   Social History  . Marital Status: Single    Spouse Name: N/A    Number of Children: N/A  . Years of Education: N/A   Occupational History  . Not on file.   Social History Main Topics  . Smoking status: Never Smoker   . Smokeless tobacco: Not on file  . Alcohol Use: Not on file  . Drug Use: Not on file  . Sexually Active: Not on file   Other Topics Concern  . Not on file   Social History Narrative  . No narrative on file   Health Maintenance  Topic Date Due  . Pap Smear  07/13/2005  . Tetanus/tdap  07/14/2006    The following portions of the patient's history were reviewed and updated as appropriate: allergies, current medications, past family history, past medical history, past social history, past surgical history and problem list.  Review of Systems Constitutional: negative for anorexia, chills, fevers and weight loss Ears, nose, mouth, throat, and face: negative for nasal congestion and sore throat Respiratory: positive for asthma, cough and dyspnea on exertion, negative for wheezing Cardiovascular: negative for chest pain, chest pressure/discomfort, fatigue and palpitations Gastrointestinal: negative for abdominal pain, change in bowel habits, constipation, diarrhea and dyspepsia Genitourinary:negative for abnormal menstrual periods, genital lesions, sexual problems and vaginal discharge, dysuria and urinary incontinence   Objective:    BP 122/88  Pulse 95  Temp(Src) 98.4 F (36.9 C) (Oral)  Ht 5\' 4"  (1.626 m)  Wt 244 lb (110.678 kg)  BMI 41.88 kg/m2  LMP 07/04/2010 General appearance: alert, cooperative, appears stated age, no distress and morbidly obese Head: Normocephalic, without obvious abnormality, atraumatic Eyes: negative findings: lids and lashes normal, conjunctivae and sclerae normal, corneas clear and  pupils equal, round, reactive to light and accomodation Ears: normal TM's and external ear canals both ears Nose: Nares normal. Septum midline. Mucosa normal. No drainage or sinus tenderness. Throat: lips, mucosa, and tongue normal; teeth and gums normal Neck: no adenopathy, supple, symmetrical, trachea midline, thyroid not enlarged, symmetric, no tenderness/mass/nodules and acanthosis nigricans along back of neck Lungs: clear to auscultation bilaterally Heart: regular rate and rhythm, S1, S2 normal, no murmur, click, rub or gallop Abdomen: normal findings: bowel sounds normal and soft, non-tender and abnormal findings:  obese Pelvic: adnexae not palpable, cervix normal in appearance, exam obscured by obesity, external genitalia normal, no adnexal masses or tenderness, no cervical motion tenderness, rectovaginal septum normal, uterus normal size, shape, and consistency and vagina normal without discharge Extremities: extremities normal, atraumatic, no cyanosis or edema  Assessment:    Healthy female exam.      Plan:     See After Visit Summary for Counseling Recommendations

## 2010-07-30 NOTE — Assessment & Plan Note (Addendum)
Pt with continued weight gain, esp over past year.  Has been on depo before weight gain started.  Father has DM and pt is concerned about this.  Has never had cholesterol checked.  No exercise.  Will check BMET for gluc and Cr; LDL as pt would prefer this over having to return for FLP; TSH. Encouraged pt to return for visit specifically for discussion about this topic.  Will encourage use of Health Coach if pt takes me up on this offer.

## 2010-07-30 NOTE — Patient Instructions (Signed)
It was great to meet you! We are drawing some labs today; if anything is abnormal I will call you, otherwise expect a letter in the mail in the next few weeks. Based on your BMI (height to weight ratio) you are at high risk for developing problems such as heart disease and blood sugar problems.  I know that dieting and exercise are difficult, but we should really try to come up with a good game plan.  If you would like for me to work with you, feel free to schedule an appointment with me to discuss this in the near future.  Otherwise, come back in 1 year for your next well-woman exam.  You will not need a PAP at that time unless the one today was abnormal.

## 2010-07-31 LAB — GC/CHLAMYDIA PROBE AMP, GENITAL
Chlamydia, DNA Probe: NEGATIVE
GC Probe Amp, Genital: NEGATIVE

## 2010-07-31 LAB — LDL CHOLESTEROL, DIRECT: Direct LDL: 139 mg/dL — ABNORMAL HIGH

## 2010-08-04 ENCOUNTER — Encounter: Payer: Self-pay | Admitting: Family Medicine

## 2010-08-30 ENCOUNTER — Ambulatory Visit: Payer: Medicaid Other | Admitting: Family Medicine

## 2010-09-14 ENCOUNTER — Inpatient Hospital Stay (INDEPENDENT_AMBULATORY_CARE_PROVIDER_SITE_OTHER)
Admission: RE | Admit: 2010-09-14 | Discharge: 2010-09-14 | Disposition: A | Discharge status: Home / Self Care | Payer: Medicaid Other | Source: Ambulatory Visit | Attending: Emergency Medicine | Admitting: Emergency Medicine

## 2010-09-14 DIAGNOSIS — R6889 Other general symptoms and signs: Secondary | ICD-10-CM

## 2010-09-14 LAB — POCT URINALYSIS DIP (DEVICE)
Glucose, UA: NEGATIVE mg/dL
Hgb urine dipstick: NEGATIVE
Ketones, ur: NEGATIVE mg/dL
Protein, ur: 30 mg/dL — AB
Specific Gravity, Urine: 1.02 (ref 1.005–1.030)
Urobilinogen, UA: 1 mg/dL (ref 0.0–1.0)
pH: 7 (ref 5.0–8.0)

## 2010-09-20 ENCOUNTER — Ambulatory Visit (INDEPENDENT_AMBULATORY_CARE_PROVIDER_SITE_OTHER): Payer: Medicaid Other | Admitting: Family Medicine

## 2010-09-20 ENCOUNTER — Encounter: Payer: Self-pay | Admitting: Family Medicine

## 2010-09-20 VITALS — BP 122/85 | HR 87 | Temp 99.0°F | Ht 64.0 in | Wt 244.0 lb

## 2010-09-20 DIAGNOSIS — Z23 Encounter for immunization: Secondary | ICD-10-CM

## 2010-09-20 DIAGNOSIS — Z309 Encounter for contraceptive management, unspecified: Secondary | ICD-10-CM

## 2010-09-20 DIAGNOSIS — E785 Hyperlipidemia, unspecified: Secondary | ICD-10-CM

## 2010-09-20 MED ORDER — MEDROXYPROGESTERONE ACETATE 150 MG/ML IM SUSP
150.0000 mg | Freq: Once | INTRAMUSCULAR | Status: AC
  Administered 2010-09-20: 150 mg via INTRAMUSCULAR

## 2010-09-20 NOTE — Patient Instructions (Signed)
Today you agreed to try diet Dr. Reino Hurst and Coke Zero. You said that you would drink NO regular soda and only diet soda. You also said you will not drink more than a case (12 pack) of soda per day. Come back and see me in ONE month so we can see how the soda change is going.

## 2010-09-25 ENCOUNTER — Encounter: Payer: Self-pay | Admitting: Family Medicine

## 2010-09-25 DIAGNOSIS — E785 Hyperlipidemia, unspecified: Secondary | ICD-10-CM | POA: Insufficient documentation

## 2010-09-25 NOTE — Progress Notes (Signed)
S: Pt comes in today to discuss obesity and elevated LDL.  LDL is 139.  Pt shows minimal interest in weight loss.  Thinks that it is some what important.  Admits to drinking a lot of regular soda- 12 pack DAILY of Dr. Reino Kent, fruit punch, some sweet tea.  Drinks a little water sometimes.  Eats lots of carbs and just eats a lot in general.  Pt was most willing to change soda from regular to diet. Mother, who was also present, wants her to eat less since she is most concerned about the volume of food that [place] eats, but [place] did not appear interested in this so we redirected to changing soda.  Her only exercise is walking to and from the bus stop.  Per mom, there is a treadmill and exercise bike at home but she does ot use it.  Per [place], she would rather work out in a gym than at home.    Has not been having any issues w/ CP, LE edema, but does have shortness of breath w/ "strenuous" exertion.   Also needs her Depo shot today.  Does not need upreg since she got ones a few days ago and got her period a few days ago as well.    ROS: Per HPI  History  Smoking status  . Current Some Day Smoker  . Types: Cigarettes  Smokeless tobacco  . Never Used    O:  Filed Vitals:   09/20/10 1423  BP: 122/85  Pulse: 87  Temp: 99 F (37.2 C)    Gen: NAD CV: RRR, no murmur Pulm: CTA bilat, no wheezes or crackles Abd: soft, NT Ext: Warm, no chronic skin changes, no edema   A/P: 25 y.o. female p/w obesity -See problem list -f/u in 1 month

## 2010-09-25 NOTE — Assessment & Plan Note (Signed)
LDL 139.  Will give pt a chance to improve this with diet/exercise but may need to start statin in the near future.

## 2010-09-25 NOTE — Assessment & Plan Note (Signed)
D/w pt the importance of weight loss, esp now w/ dx of HLD.  Pt agrees that weight loss should be important and is a little interested.  Will try to switch from drinking a case of REGULAR soda a day to diet soda.  Will f/u in 1-2 months to see how this goal is going.  At that time will discuss cutting down soda vs exercise.  Encouraged pt to come up with her own goals b/t now and her next appt.

## 2010-09-27 LAB — URINALYSIS, ROUTINE W REFLEX MICROSCOPIC
Bilirubin Urine: NEGATIVE
Glucose, UA: NEGATIVE
Ketones, ur: NEGATIVE
Nitrite: NEGATIVE
Protein, ur: NEGATIVE

## 2010-09-27 LAB — GC/CHLAMYDIA PROBE AMP, GENITAL
Chlamydia, DNA Probe: POSITIVE — AB
GC Probe Amp, Genital: NEGATIVE

## 2010-09-27 LAB — WET PREP, GENITAL: Yeast Wet Prep HPF POC: NONE SEEN

## 2010-09-27 LAB — POCT PREGNANCY, URINE: Preg Test, Ur: NEGATIVE

## 2010-09-27 LAB — URINE MICROSCOPIC-ADD ON

## 2010-10-05 ENCOUNTER — Ambulatory Visit (INDEPENDENT_AMBULATORY_CARE_PROVIDER_SITE_OTHER): Payer: Medicaid Other | Admitting: Family Medicine

## 2010-10-05 DIAGNOSIS — Z20828 Contact with and (suspected) exposure to other viral communicable diseases: Secondary | ICD-10-CM

## 2010-10-05 DIAGNOSIS — R109 Unspecified abdominal pain: Secondary | ICD-10-CM | POA: Insufficient documentation

## 2010-10-05 DIAGNOSIS — B9689 Other specified bacterial agents as the cause of diseases classified elsewhere: Secondary | ICD-10-CM

## 2010-10-05 DIAGNOSIS — N946 Dysmenorrhea, unspecified: Secondary | ICD-10-CM

## 2010-10-05 DIAGNOSIS — Z202 Contact with and (suspected) exposure to infections with a predominantly sexual mode of transmission: Secondary | ICD-10-CM

## 2010-10-05 DIAGNOSIS — N760 Acute vaginitis: Secondary | ICD-10-CM

## 2010-10-05 DIAGNOSIS — A499 Bacterial infection, unspecified: Secondary | ICD-10-CM

## 2010-10-05 LAB — POCT WET PREP (WET MOUNT): Yeast Wet Prep HPF POC: NEGATIVE

## 2010-10-05 MED ORDER — METRONIDAZOLE 500 MG PO TABS: 500.0000 mg | ORAL_TABLET | Freq: Two times a day (BID) | ORAL | Status: AC

## 2010-10-05 NOTE — Progress Notes (Signed)
  Subjective:    Patient ID: Kelli Hurst, female    DOB: Oct 12, 1985, 25 y.o.   MRN: [id number]  HPIWork in appt.  Here to get a pregnancy test.  Has been having intermittent abdominal pain since June.  Having unprotected sex.  Today she is concerned if she is pregnant because her friends tell her she might be.  She is up to date on depo- with last shot 9.17.  Difficult for patient to describe pain.  Points to epigastric area.  She cannot describe.  States no pain today.  Better after she eats.  Better when she lays down but hurts when she lays directly on her back.   Denies vaginal discharge, dysuria, fever, diarrhea, constipation, emesis.   Review of Systems See above    Objective:   Physical Exam GEN: Alert & Oriented, No acute distress.  Likely mental delay. CV:  Regular Rate & Rhythm, no murmur Respiratory:  Normal work of breathing, CTAB Abd:  + BS, soft, no tenderness to palpation Ext: no pre-tibial edema Pelvic Exam:        External: normal female genitalia without lesions or masses        Vagina: normal without lesions or masses        Cervix: normal without lesions or masses        Adnexa: normal bimanual exam without masses or fullness        Uterus: normal by palpation        Samples for Wet prep, GC/Chlamydia obtained         Assessment & Plan:

## 2010-10-05 NOTE — Progress Notes (Signed)
Addended by: Macy Mis on: 10/05/2010 04:30 PM   Modules accepted: Orders

## 2010-10-05 NOTE — Assessment & Plan Note (Signed)
Will treat with metronidazole.

## 2010-10-05 NOTE — Assessment & Plan Note (Signed)
Checked wet prep and gc/chlmaydia today. Will also check HIV/RPR as her history of high risk behavior and no recent check.  Difficult patient history to elicit, no abdominal pain on exam. I suspect may be possibly GERD.  Since is now resolved, advised her to take TUMS prn, and to return if further pain.

## 2010-10-05 NOTE — Patient Instructions (Signed)
Use condoms always Will check for STD's today. Try some tums at night If you keep having stomach problems, make appt to see yoru doctor

## 2010-10-06 ENCOUNTER — Emergency Department (HOSPITAL_COMMUNITY)
Admission: EM | Admit: 2010-10-06 | Discharge: 2010-10-06 | Disposition: A | Discharge status: Home / Self Care | Payer: Medicaid Other | Attending: Emergency Medicine | Admitting: Emergency Medicine

## 2010-10-06 ENCOUNTER — Encounter: Payer: Self-pay | Admitting: Family Medicine

## 2010-10-06 ENCOUNTER — Telehealth: Payer: Self-pay | Admitting: *Deleted

## 2010-10-06 DIAGNOSIS — Z3202 Encounter for pregnancy test, result negative: Secondary | ICD-10-CM | POA: Insufficient documentation

## 2010-10-06 LAB — RPR

## 2010-10-06 LAB — GC/CHLAMYDIA PROBE AMP, GENITAL: GC Probe Amp, Genital: NEGATIVE

## 2010-10-06 NOTE — Telephone Encounter (Signed)
Message copied by Aram Beecham on Wed Oct 06, 2010  2:19 PM ------      Message from: Macy Mis      Created: Tue Oct 05, 2010  4:30 PM       Please call and let her  Know i sent metronidazole for BV

## 2010-10-06 NOTE — Telephone Encounter (Signed)
Patient not home, left message with female for patient to call back.

## 2010-10-14 ENCOUNTER — Telehealth: Payer: Self-pay | Admitting: Family Medicine

## 2010-10-14 NOTE — Telephone Encounter (Signed)
Has gotten her Depo shot a little early and wants to know if it should still be working.

## 2010-10-14 NOTE — Telephone Encounter (Signed)
Patient with a lot of questions about the effectiveness of the depo shot.  She has been receiving them on time but for some reason she is concerned about being pregnant.  Advised her that the medication was effective for 90 days and would not completely leave her system (if she did not receive more depo) for 120 to 200 days after her last injection.  Also told her that if she ever came in for her injection beyond the recommended date we would do a pregnancy test before we administered another does.  Patient still did not seem convinced that she was fully protected.  Advised her to come in for a pregnancy test if she was still unsure.

## 2010-10-15 NOTE — Telephone Encounter (Signed)
Left another message informing that rx was at pharmacy.

## 2010-12-22 ENCOUNTER — Telehealth: Payer: Self-pay | Admitting: Family Medicine

## 2010-12-22 NOTE — Telephone Encounter (Signed)
Pt wants to know if you can get pregnant while on depo?

## 2010-12-22 NOTE — Telephone Encounter (Signed)
Called patient and explained to her that there is no 100% guarantee that she will not get pregnant while on BC including depo. Patient states that means the depo does not work, I explained to her that the depo shot is very effective and that she should continue to get it on time if she does not plan to get pregnant. Offered an appointment to come in for pregnancy test but she declined, only wanted to know the effectiveness of the depo shot.Busick, Rodena Medin

## 2010-12-24 ENCOUNTER — Telehealth: Payer: Self-pay | Admitting: Family Medicine

## 2010-12-24 NOTE — Telephone Encounter (Signed)
Left message for patient to return call, she needs an office visit.Busick, Rodena Medin

## 2010-12-24 NOTE — Telephone Encounter (Signed)
possiblity that she could be pregnant and wants to talk to her doctor.

## 2010-12-29 NOTE — Telephone Encounter (Signed)
appt 12/30/10 .Arlyss Repress

## 2010-12-30 ENCOUNTER — Encounter: Payer: Self-pay | Admitting: Family Medicine

## 2010-12-30 ENCOUNTER — Ambulatory Visit (INDEPENDENT_AMBULATORY_CARE_PROVIDER_SITE_OTHER): Payer: Medicaid Other | Admitting: Family Medicine

## 2010-12-30 VITALS — BP 136/99 | HR 90 | Temp 97.9°F | Ht 65.0 in | Wt 247.5 lb

## 2010-12-30 DIAGNOSIS — N912 Amenorrhea, unspecified: Secondary | ICD-10-CM

## 2010-12-30 DIAGNOSIS — Z309 Encounter for contraceptive management, unspecified: Secondary | ICD-10-CM

## 2010-12-30 DIAGNOSIS — IMO0001 Reserved for inherently not codable concepts without codable children: Secondary | ICD-10-CM

## 2010-12-30 MED ORDER — MEDROXYPROGESTERONE ACETATE 150 MG/ML IM SUSP
150.0000 mg | Freq: Once | INTRAMUSCULAR | Status: AC
  Administered 2010-12-30: 150 mg via INTRAMUSCULAR

## 2010-12-30 NOTE — Assessment & Plan Note (Signed)
Urine pregnancy test negative. Amenorrhea likely secondary to Depo-Provera injection. Patient due for injection on 12/17 and this was given today.

## 2010-12-30 NOTE — Patient Instructions (Signed)
Your pregnancy test was negative. Please fill the triamcinolone cream prescription that you got from the [facility] the emergency room. This will help finish clearing up any bug bites you have left. Make sure you washed all of the close, she eats, and any other cloth or fabric things that were with you in a house with bedbugs in [place]. We gave you your Depo shot today. Please come back in 3 months for your next injection. Please come back to discuss your weight whenever is convenient for you.

## 2010-12-30 NOTE — Progress Notes (Signed)
S: Pt comes in today for amenorrhea, f/u from [facility]  AMENORRHEA Last period 09/20/2010.  Is on Depo and last injection was 09/20/10.  Pt is concerned she is pregnant and would like to have a pregnancy test done.  No vaginal bleeding or discharge. No nausea, vomiting, breast tenderness.   [facility] Pt was seen at [facility] in the ED for Bedbugs on 12/26/10 and is following up for this as well.  She was given prednisone po and triamcinolone ointment but has not yet filled these Rx'es b/c her Medicaid would not fill them out in [place]. Feels like her arms have improved.   ROS: Per HPI  History  Smoking status  . Current Some Day Smoker  . Types: Cigarettes  Smokeless tobacco  . Never Used    O:  Filed Vitals:   12/30/10 1109  BP: 136/99  Pulse: 90  Temp: 97.9 F (36.6 C)    Gen: NAD Abd: soft, NT, obese Ext: Warm, no chronic skin changes, no edema, no rash or bites  Upreg: negative   A/P: 25 y.o. female p/w amenorrhea, f/u from bedbugs -See problem list -f/u PRN

## 2011-03-02 ENCOUNTER — Ambulatory Visit: Payer: Medicaid Other | Admitting: Family Medicine

## 2011-06-08 ENCOUNTER — Telehealth: Payer: Self-pay | Admitting: Family Medicine

## 2011-06-08 NOTE — Telephone Encounter (Signed)
Patient is calling with a question about her Birth Control pills.

## 2011-06-08 NOTE — Telephone Encounter (Signed)
Returned call to patient.  Left message to call our office back.  Kimothy Kishimoto Ann, RN  

## 2011-06-08 NOTE — Telephone Encounter (Signed)
Patient called back and wants to know if she can get pregnant while taking pills.  Took her last pill on 05/31/11 and has not started a new pack of pills.  Has been having unprotected intercourse.  Explained to patient if she is not taking pills at the same time everyday and having unprotected intercourse, she can become pregnant.  Patient has moved to [place] with a friend, "has been having sex", and has not had her BCPs refilled.  Informed patient that she may need pregnancy test.  Phone call ended and will await call back from patient.  Gaylene Brooks, RN

## 2011-07-25 ENCOUNTER — Telehealth (HOSPITAL_COMMUNITY): Payer: Self-pay | Admitting: *Deleted

## 2011-07-29 ENCOUNTER — Inpatient Hospital Stay (HOSPITAL_COMMUNITY): Admission: RE | Admit: 2011-07-29 | Payer: Medicaid Other | Source: Ambulatory Visit

## 2011-08-23 ENCOUNTER — Telehealth: Payer: Self-pay | Admitting: Family Medicine

## 2011-08-23 NOTE — Telephone Encounter (Signed)
Needs to ask nurse a question but would not give any details.

## 2011-08-23 NOTE — Telephone Encounter (Signed)
Spoke with patient . States she had been on depo but switched to pills back in Feb 2013. RX was given to her by a clinic in [place].  She has not started a new pack this month the first of August. She is unable to explain why. However states her last period was June she thinks, not sure. Has had unprotected sex.  Has moved back to [place] now .  Advised first she will need appointment for urine preg test. Then restart birth control as she and MD decide.  Appointment scheduled for 08/23 with PCP . Advised to use condoms  in the meantime.

## 2011-08-26 ENCOUNTER — Ambulatory Visit: Payer: Medicaid Other | Admitting: Family Medicine

## 2011-09-28 NOTE — Telephone Encounter (Signed)
Preadmission screen  

## 2011-10-25 ENCOUNTER — Telehealth: Payer: Self-pay | Admitting: Family Medicine

## 2011-10-25 NOTE — Telephone Encounter (Signed)
Patient would like to speak to the nurse about some symptoms she is having during her pregnancy, she is spotting - bright pink/red, it did that for about a week or two.

## 2011-10-25 NOTE — Telephone Encounter (Addendum)
Has been on Depo Provera and changed to pills last year.  Has not been on BCP since March.  Had period last month.  Has been having some abdominal pain and some spotting.  Has been having nausea.  Not sure if she is pregnant.  Appt scheduled for 10/26/11 with Dr. Fara Boros for 1:45pm.  Gaylene Brooks, RN

## 2011-10-26 ENCOUNTER — Ambulatory Visit: Payer: Medicaid Other | Admitting: Family Medicine

## 2011-12-19 ENCOUNTER — Telehealth: Payer: Self-pay | Admitting: Family Medicine

## 2011-12-19 NOTE — Telephone Encounter (Signed)
Having abd pain for the last few days and hasn't seen her period - would like to talk to nurse about what she can do,.

## 2011-12-19 NOTE — Telephone Encounter (Signed)
Patient states she is late with period now. From her history she started last period 11/18. Would be due any time now. She is concerned because she has had unprotected sex, Stopped taking birth control med because" it was blowing her up ".  Now for past 2 days has had abdominal discomfort  throughout abdomen.   Hard for her to describe exactly where pain is  But mostly low on both sides. Has nausea, but cannot throw up. She feels relief when she drinks or eats something. Notices mostly at night. No discharge , no fever. Appointment was offered for today but she asks for appointment tomorrow.  Appointment scheduled tomorrow with PCP.

## 2011-12-20 ENCOUNTER — Ambulatory Visit: Payer: Medicaid Other | Admitting: Family Medicine

## 2012-01-12 ENCOUNTER — Encounter: Payer: Self-pay | Admitting: Family Medicine

## 2012-01-12 NOTE — Telephone Encounter (Signed)
Error

## 2012-01-16 NOTE — Telephone Encounter (Signed)
This encounter was created in error - please disregard.

## 2012-03-07 ENCOUNTER — Telehealth: Payer: Self-pay | Admitting: Family Medicine

## 2012-03-07 NOTE — Telephone Encounter (Signed)
LMP 01/17/12.  Has had unprotected sex. Appointment scheduled for tomorrow for evaluation of missed period.

## 2012-03-07 NOTE — Telephone Encounter (Signed)
Pt is asking to speak to nurse about her period - she hasn't seen it come on for 6 weeks- concerned (has missed several appts)

## 2012-03-08 ENCOUNTER — Ambulatory Visit: Payer: Medicaid Other | Admitting: Family Medicine

## 2012-06-28 ENCOUNTER — Telehealth: Payer: Self-pay | Admitting: Family Medicine

## 2012-06-28 NOTE — Telephone Encounter (Signed)
Patient would like to speak to the nurse about birth control.

## 2012-06-28 NOTE — Telephone Encounter (Signed)
Last time patient was on birth control was in march 2013 when she stopped taking the birth control pills.  Her boyfriend and her have decided to try and become pregnant and wants to know if she can since [facility] in March 2013.  I instructed patient that it is very possible since it has been so very long since on birth control.  Pt verbalized understanding.  Mario Voong, Darlyne Russian, CMA

## 2012-06-29 ENCOUNTER — Telehealth: Payer: Self-pay | Admitting: Family Medicine

## 2012-06-29 NOTE — Telephone Encounter (Signed)
Patient is calling with another question about birth control.

## 2012-06-29 NOTE — Telephone Encounter (Signed)
Pt wanted to know how long it would take for a "girl to show if she were to become pregnant"?  I told patient that it varies from woman to woman.  Pt verbalized understanding.  Parker Wherley, Darlyne Russian, CMA

## 2012-08-10 ENCOUNTER — Ambulatory Visit (INDEPENDENT_AMBULATORY_CARE_PROVIDER_SITE_OTHER): Payer: Medicare Other | Admitting: Family Medicine

## 2012-08-10 ENCOUNTER — Encounter: Payer: Self-pay | Admitting: Family Medicine

## 2012-08-10 VITALS — BP 121/85 | HR 90 | Temp 98.3°F | Wt 227.5 lb

## 2012-08-10 DIAGNOSIS — Z0000 Encounter for general adult medical examination without abnormal findings: Secondary | ICD-10-CM

## 2012-08-10 DIAGNOSIS — Z23 Encounter for immunization: Secondary | ICD-10-CM

## 2012-08-10 DIAGNOSIS — E785 Hyperlipidemia, unspecified: Secondary | ICD-10-CM

## 2012-08-10 DIAGNOSIS — N912 Amenorrhea, unspecified: Secondary | ICD-10-CM

## 2012-08-10 DIAGNOSIS — Z01419 Encounter for gynecological examination (general) (routine) without abnormal findings: Secondary | ICD-10-CM

## 2012-08-10 LAB — POCT URINE PREGNANCY: Preg Test, Ur: NEGATIVE

## 2012-08-10 NOTE — Patient Instructions (Addendum)
Today we did a physical exam and talked about birth control options.  Please continue trying to limit junk food and keep walking as these will help decrease your cholesterol. We will recheck this today.  We are checking your cholesterol, blood sugar and hormone levels. If any of your labs are abnormal we will call you, otherwise you will get a letter.  We gave you a tetanus shot today.  Please stop smoking while it is still occasional. The longer you smoke, the harder it will be to quit.  Thank you and it was nice meeting you today.  Dr. Essie Hart and Cholesterol Control Diet Cholesterol levels in your body are determined significantly by your diet. Cholesterol levels may also be related to heart disease. The following material helps to explain this relationship and discusses what you can do to help keep your heart healthy. Not all cholesterol is bad. Low-density lipoprotein (LDL) cholesterol is the "bad" cholesterol. It may cause fatty deposits to build up inside your arteries. High-density lipoprotein (HDL) cholesterol is "good." It helps to remove the "bad" LDL cholesterol from your blood. Cholesterol is a very important risk factor for heart disease. Other risk factors are high blood pressure, smoking, stress, heredity, and weight. The heart muscle gets its supply of blood through the coronary arteries. If your LDL cholesterol is high and your HDL cholesterol is low, you are at risk for having fatty deposits build up in your coronary arteries. This leaves less room through which blood can flow. Without sufficient blood and oxygen, the heart muscle cannot function properly and you may feel chest pains (angina pectoris). When a coronary artery closes up entirely, a part of the heart muscle may die causing a heart attack (myocardial infarction). CHECKING CHOLESTEROL When your caregiver sends your blood to a lab to be examined for cholesterol, a complete lipid (fat) profile may be done. With this  test, the total amount of cholesterol and levels of LDL and HDL are determined. Triglycerides are a type of fat that circulates in the blood. They can also be used to determine heart disease risk. The list below describes what the numbers should be: Test: Total Cholesterol.  Less than 200 mg/dl. Test: LDL "bad cholesterol."  Less than 100 mg/dl.  Less than 70 mg/dl if you are at very high risk of a heart attack or sudden cardiac death. Test: HDL "good cholesterol."  Greater than 50 mg/dl for women.  Greater than 40 mg/dl for men. Test: Triglycerides.  Less than 150 mg/dl. CONTROLLING CHOLESTEROL WITH DIET Although exercise and lifestyle factors are important, your diet is key. That is because certain foods are known to raise cholesterol and others to lower it. The goal is to balance foods for their effect on cholesterol and more importantly, to replace saturated and trans fat with other types of fat, such as monounsaturated fat, polyunsaturated fat, and omega-3 fatty acids. On average, a person should consume no more than 15 to 17 g of saturated fat daily. Saturated and trans fats are considered "bad" fats, and they will raise LDL cholesterol. Saturated fats are primarily found in animal products such as meats, butter, and cream. However, that does not mean you need to give up all your favorite foods. Today, there are good tasting, low-fat, low-cholesterol substitutes for most of the things you like to eat. Choose low-fat or nonfat alternatives. Choose round or loin cuts of red meat. These types of cuts are lowest in fat and cholesterol. Chicken (without the  skin), fish, veal, and ground [place] breast are great choices. Eliminate fatty meats, such as hot dogs and salami. Even shellfish have little or no saturated fat. Have a 3 oz (85 g) portion when you eat lean meat, poultry, or fish. Trans fats are also called "partially hydrogenated oils." They are oils that have been scientifically  manipulated so that they are solid at room temperature resulting in a longer shelf life and improved taste and texture of foods in which they are added. Trans fats are found in stick margarine, some tub margarines, cookies, crackers, and baked goods.  When baking and cooking, oils are a great substitute for butter. The monounsaturated oils are especially beneficial since it is believed they lower LDL and raise HDL. The oils you should avoid entirely are saturated tropical oils, such as coconut and palm.  Remember to eat a lot from food groups that are naturally free of saturated and trans fat, including fish, fruit, vegetables, beans, grains (barley, rice, couscous, bulgur wheat), and pasta (without cream sauces).  IDENTIFYING FOODS THAT LOWER CHOLESTEROL  Soluble fiber may lower your cholesterol. This type of fiber is found in fruits such as apples, vegetables such as broccoli, potatoes, and carrots, legumes such as beans, peas, and lentils, and grains such as barley. Foods fortified with plant sterols (phytosterol) may also lower cholesterol. You should eat at least 2 g per day of these foods for a cholesterol lowering effect.  Read package labels to identify low-saturated fats, trans fat free, and low-fat foods at the supermarket. Select cheeses that have only 2 to 3 g saturated fat per ounce. Use a heart-healthy tub margarine that is free of trans fats or partially hydrogenated oil. When buying baked goods (cookies, crackers), avoid partially hydrogenated oils. Breads and muffins should be made from whole grains (whole-wheat or whole oat flour, instead of "flour" or "enriched flour"). Buy non-creamy canned soups with reduced salt and no added fats.  FOOD PREPARATION TECHNIQUES  Never deep-fry. If you must fry, either stir-fry, which uses very little fat, or use non-stick cooking sprays. When possible, broil, bake, or roast meats, and steam vegetables. Instead of putting butter or margarine on vegetables,  use lemon and herbs, applesauce, and cinnamon (for squash and sweet potatoes), nonfat yogurt, salsa, and low-fat dressings for salads.  LOW-SATURATED FAT / LOW-FAT FOOD SUBSTITUTES Meats / Saturated Fat (g)  Avoid: Steak, marbled (3 oz/85 g) / 11 g  Choose: Steak, lean (3 oz/85 g) / 4 g  Avoid: Hamburger (3 oz/85 g) / 7 g  Choose: Hamburger, lean (3 oz/85 g) / 5 g  Avoid: Ham (3 oz/85 g) / 6 g  Choose: Ham, lean cut (3 oz/85 g) / 2.4 g  Avoid: Chicken, with skin, dark meat (3 oz/85 g) / 4 g  Choose: Chicken, skin removed, dark meat (3 oz/85 g) / 2 g  Avoid: Chicken, with skin, light meat (3 oz/85 g) / 2.5 g  Choose: Chicken, skin removed, light meat (3 oz/85 g) / 1 g Dairy / Saturated Fat (g)  Avoid: Whole milk (1 cup) / 5 g  Choose: Low-fat milk, 2% (1 cup) / 3 g  Choose: Low-fat milk, 1% (1 cup) / 1.5 g  Choose: Skim milk (1 cup) / 0.3 g  Avoid: Hard cheese (1 oz/28 g) / 6 g  Choose: Skim milk cheese (1 oz/28 g) / 2 to 3 g  Avoid: Cottage cheese, 4% fat (1 cup) / 6.5 g  Choose: Low-fat cottage cheese,  1% fat (1 cup) / 1.5 g  Avoid: Ice cream (1 cup) / 9 g  Choose: Sherbet (1 cup) / 2.5 g  Choose: Nonfat frozen yogurt (1 cup) / 0.3 g  Choose: Frozen fruit bar / trace  Avoid: Whipped cream (1 tbs) / 3.5 g  Choose: Nondairy whipped topping (1 tbs) / 1 g Condiments / Saturated Fat (g)  Avoid: Mayonnaise (1 tbs) / 2 g  Choose: Low-fat mayonnaise (1 tbs) / 1 g  Avoid: Butter (1 tbs) / 7 g  Choose: Extra light margarine (1 tbs) / 1 g  Avoid: Coconut oil (1 tbs) / 11.8 g  Choose: Olive oil (1 tbs) / 1.8 g  Choose: Corn oil (1 tbs) / 1.7 g  Choose: Safflower oil (1 tbs) / 1.2 g  Choose: Sunflower oil (1 tbs) / 1.4 g  Choose: Soybean oil (1 tbs) / 2.4 g  Choose: Canola oil (1 tbs) / 1 g Document Released: 12/20/2004 Document Revised: 03/14/2011 Document Reviewed: 06/10/2010 [employer] Patient Information 2014 [place], [place].

## 2012-08-10 NOTE — Assessment & Plan Note (Signed)
UPT neg. No signs of hyperandrogenism but concern remains with oligomenorrhea and obesity. Will check testosterone, TSH, A1C, lipids. Encouraged her to continue weight loss efforts.

## 2012-08-10 NOTE — Assessment & Plan Note (Signed)
Will reassess today as she has lost weight and is exercising more

## 2012-08-10 NOTE — Progress Notes (Signed)
  Subjective:    Patient ID: Kelli Hurst, female    DOB: [birth year], 27 y.o.   MRN: [id number]  HPI Patient has no complaints and presents for her annual well woman exam. She is curious about whether she might be pregnant because she has had unprotected sex with a steady boyfriend. Her period is somewhat irregular normally and she is unsure whether she had one in July.   Review of Systems  Constitutional: Negative for fever, activity change, appetite change, fatigue and unexpected weight change.  HENT: Negative.   Eyes: Negative.   Respiratory: Negative.   Cardiovascular: Negative.   Gastrointestinal: Negative.   Endocrine: Negative for cold intolerance, heat intolerance, polydipsia, polyphagia and polyuria.  Genitourinary: Positive for menstrual problem. Negative for dysuria, vaginal discharge, genital sores and pelvic pain.  Skin: Negative.   Neurological: Negative.        Objective:   Physical Exam  Constitutional: She is oriented to person, place, and time. She appears well-developed and well-nourished. No distress.  HENT:  Head: Normocephalic and atraumatic.  Right Ear: External ear normal.  Left Ear: External ear normal.  Eyes: Conjunctivae are normal. Right eye exhibits no discharge. Left eye exhibits no discharge. No scleral icterus.  Neck: Normal range of motion. Neck supple. No thyromegaly present.  Cardiovascular: Normal rate, regular rhythm, normal heart sounds and intact distal pulses.   No murmur heard. Pulmonary/Chest: Effort normal and breath sounds normal. No respiratory distress. She has no wheezes.  Abdominal: Soft. Bowel sounds are normal. She exhibits no distension and no mass. There is no tenderness. There is no rebound and no guarding.  Musculoskeletal: Normal range of motion. She exhibits no edema and no tenderness.  Lymphadenopathy:    She has no cervical adenopathy.  Neurological: She is alert and oriented to person, place, and time.  Skin: Skin is  warm and dry. She is not diaphoretic.  No acanthosis nigricans  Psychiatric: She has a normal mood and affect.          Assessment & Plan:

## 2012-08-10 NOTE — Assessment & Plan Note (Signed)
Patient has been trying to decrease junk food in her diet and walking more, has lost 20 pounds since her last visit nearly 2 years ago. I encouraged her to keep up these efforts as they will be beneficial in many ways.

## 2012-08-10 NOTE — Assessment & Plan Note (Signed)
Counseled re: her occasional tobacco use and birth control methods as she is unsure about wanting to get pregnant. She says she needs to think about them. She wants to defer GC/chlamydia testing until next pap but wants HIV/RPR today. Will also check A1C, testosterone, TSH, suspect she may have PCOS.

## 2012-08-11 LAB — LIPID PANEL
Cholesterol: 173 mg/dL (ref 0–200)
HDL: 40 mg/dL (ref >39)
Total CHOL/HDL Ratio: 4.3 Ratio
Triglycerides: 119 mg/dL (ref <150)
VLDL: 24 mg/dL (ref 0–40)

## 2012-08-11 LAB — TESTOSTERONE: Testosterone: 49 ng/dL (ref 10–70)

## 2012-08-11 LAB — RPR

## 2012-08-20 ENCOUNTER — Encounter: Payer: Self-pay | Admitting: Family Medicine

## 2012-09-24 ENCOUNTER — Telehealth: Payer: Self-pay | Admitting: Family Medicine

## 2012-09-24 NOTE — Telephone Encounter (Signed)
Pt called and would like a refill of her [facility] sent to [facility] on [address]. JW

## 2012-09-25 MED ORDER — NORGESTIMATE-ETH ESTRADIOL 0.25-35 MG-MCG PO TABS: 1.0000 | ORAL_TABLET | Freq: Every day | ORAL | Status: DC

## 2012-09-25 NOTE — Telephone Encounter (Signed)
Please advise patient that prescription for sprintec sent in. There was no established prescription for this in her chart but we discussed starting at her last visit. If she has any issues she should come see me. Caution her about risk of clots with smoking and birth control.

## 2012-09-26 NOTE — Telephone Encounter (Signed)
Attempted to call patient and call unable to go through.  Tyge Somers, Darlyne Russian, CMA

## 2012-10-11 ENCOUNTER — Ambulatory Visit: Payer: Medicaid Other | Admitting: Family Medicine

## 2012-10-26 ENCOUNTER — Ambulatory Visit: Payer: Medicare Other | Admitting: Family Medicine

## 2012-10-26 ENCOUNTER — Encounter: Payer: Self-pay | Admitting: Family Medicine

## 2012-10-26 DIAGNOSIS — E669 Obesity, unspecified: Secondary | ICD-10-CM | POA: Insufficient documentation

## 2012-11-06 ENCOUNTER — Ambulatory Visit: Payer: Medicare Other | Admitting: Family Medicine

## 2012-11-08 ENCOUNTER — Ambulatory Visit: Payer: Medicare Other | Admitting: Family Medicine

## 2012-11-20 ENCOUNTER — Ambulatory Visit: Payer: Medicare Other | Admitting: Family Medicine

## 2012-12-21 ENCOUNTER — Ambulatory Visit: Payer: Medicare Other | Admitting: Family Medicine

## 2012-12-26 ENCOUNTER — Ambulatory Visit: Payer: Medicare Other | Admitting: Family Medicine

## 2012-12-31 ENCOUNTER — Encounter: Payer: Self-pay | Admitting: *Deleted

## 2013-06-24 ENCOUNTER — Ambulatory Visit: Payer: Medicare Other | Admitting: Family Medicine

## 2013-06-28 ENCOUNTER — Ambulatory Visit (INDEPENDENT_AMBULATORY_CARE_PROVIDER_SITE_OTHER): Payer: Medicare Other | Admitting: Family Medicine

## 2013-06-28 ENCOUNTER — Other Ambulatory Visit (HOSPITAL_COMMUNITY)
Admission: RE | Admit: 2013-06-28 | Discharge: 2013-06-28 | Disposition: A | Discharge status: Home / Self Care | Payer: Medicare Other | Source: Ambulatory Visit | Attending: Family Medicine | Admitting: Family Medicine

## 2013-06-28 ENCOUNTER — Encounter: Payer: Self-pay | Admitting: Family Medicine

## 2013-06-28 VITALS — BP 126/80 | HR 93 | Temp 98.7°F | Wt 227.8 lb

## 2013-06-28 DIAGNOSIS — Z01419 Encounter for gynecological examination (general) (routine) without abnormal findings: Secondary | ICD-10-CM | POA: Insufficient documentation

## 2013-06-28 DIAGNOSIS — Z20828 Contact with and (suspected) exposure to other viral communicable diseases: Secondary | ICD-10-CM

## 2013-06-28 DIAGNOSIS — Z0000 Encounter for general adult medical examination without abnormal findings: Secondary | ICD-10-CM

## 2013-06-28 DIAGNOSIS — Z113 Encounter for screening for infections with a predominantly sexual mode of transmission: Secondary | ICD-10-CM | POA: Insufficient documentation

## 2013-06-28 DIAGNOSIS — R635 Abnormal weight gain: Secondary | ICD-10-CM

## 2013-06-28 LAB — POCT WET PREP (WET MOUNT): CLUE CELLS WET PREP WHIFF POC: NEGATIVE

## 2013-06-28 NOTE — Patient Instructions (Signed)
Please make an appointment to come back in 1-2 weeks to have a mirena placed. Please take 3 advil or ibuprofen 1 hour before you come. We can discuss your lab results at that time.  Thank you

## 2013-06-29 LAB — BASIC METABOLIC PANEL
BUN: 12 mg/dL (ref 6–23)
CHLORIDE: 100 meq/L (ref 96–112)
CO2: 25 mEq/L (ref 19–32)
Calcium: 9.3 mg/dL (ref 8.4–10.5)
Creat: 0.82 mg/dL (ref 0.50–1.10)
GLUCOSE: 78 mg/dL (ref 70–99)
POTASSIUM: 3.9 meq/L (ref 3.5–5.3)
Sodium: 133 mEq/L — ABNORMAL LOW (ref 135–145)

## 2013-06-29 LAB — RPR

## 2013-06-29 LAB — HIV ANTIBODY (ROUTINE TESTING W REFLEX): HIV 1&2 Ab, 4th Generation: NONREACTIVE

## 2013-06-29 LAB — TSH: TSH: 0.811 u[IU]/mL (ref 0.350–4.500)

## 2013-07-01 LAB — CYTOLOGY - PAP

## 2013-07-01 NOTE — Progress Notes (Signed)
   Subjective:    Patient ID: Kelli Hurst, female    DOB: 1985/10/18, 28 y.o.   MRN: [id number]  Gynecologic Exam The patient's pertinent negatives include no pelvic pain or vaginal discharge.   Pt presents for annual. No concerns. Mom worried about her weight. Pt not as worried, not very interested in making any changes.   Not on birth control because of weight gain with depo and OCPs. Discussed mirena, pt and mother agreeable to this and will come back in to have it placed.   Review of Systems  Genitourinary: Negative for vaginal discharge, genital sores, pelvic pain and dyspareunia.  All other systems reviewed and are negative.      Objective:   Physical Exam  Nursing note and vitals reviewed. Constitutional: She is oriented to person, place, and time. She appears well-developed and well-nourished. No distress.  HENT:  Head: Normocephalic and atraumatic.  Eyes: Conjunctivae are normal. Right eye exhibits no discharge. Left eye exhibits no discharge. No scleral icterus.  Neck: Normal range of motion. Neck supple. No thyromegaly present.  Cardiovascular: Normal rate, regular rhythm and normal heart sounds.   No murmur heard. Pulmonary/Chest: Effort normal and breath sounds normal. No respiratory distress. She has no wheezes.  Abdominal: Soft. She exhibits no distension. There is no tenderness. There is no rebound.  Genitourinary: Vagina normal and uterus normal. No labial fusion. There is no rash, tenderness, lesion or injury on the right labia. There is no rash, tenderness, lesion or injury on the left labia. Cervix exhibits no motion tenderness, no discharge and no friability. Right adnexum displays no mass, no tenderness and no fullness. Left adnexum displays no mass, no tenderness and no fullness. No erythema or tenderness around the vagina. No signs of injury around the vagina. No vaginal discharge found.  Musculoskeletal: Normal range of motion. She exhibits no edema and no  tenderness.  Lymphadenopathy:    She has no cervical adenopathy.  Neurological: She is alert and oriented to person, place, and time.  Skin: Skin is warm and dry. She is not diaphoretic.  Psychiatric: She has a normal mood and affect. Her behavior is normal.          Assessment & Plan:

## 2013-07-01 NOTE — Assessment & Plan Note (Addendum)
Pap today, STI testing, discussed birth control options. Pt wants Mirena and will return for this in 2 weeks. Discussed diet and exercise. Pt reports walking daily but diet includes few veggies and lots of soda, pt agreed to work on these - pap, gc/ct, hiv, rpr, bmet sent today

## 2013-07-04 ENCOUNTER — Telehealth: Payer: Self-pay | Admitting: *Deleted

## 2013-07-04 ENCOUNTER — Other Ambulatory Visit: Payer: Self-pay | Admitting: Family Medicine

## 2013-07-04 DIAGNOSIS — A749 Chlamydial infection, unspecified: Secondary | ICD-10-CM

## 2013-07-04 MED ORDER — AZITHROMYCIN 500 MG PO TABS: 1000.0000 mg | ORAL_TABLET | Freq: Once | ORAL | Status: DC

## 2013-07-04 NOTE — Telephone Encounter (Signed)
Informed of normal labs

## 2013-07-04 NOTE — Telephone Encounter (Signed)
Left message with patient's mother for her to return call. Please inform patient that she tested positive for chlamydia and needs to be treated. If she returns call today ask which pharmacy she uses and we can send in an antibiotic.Marland Kitchen.Busick, Rodena Medinobert Lee

## 2013-07-04 NOTE — Telephone Encounter (Signed)
[facility] [address]

## 2013-07-04 NOTE — Telephone Encounter (Signed)
LM for patient to call back.

## 2013-07-04 NOTE — Telephone Encounter (Signed)
Message copied by Farrell OursEVANS, Cyana Shook K on Thu Jul 04, 2013 11:28 AM ------      Message from: Abram SanderADAMO, ELENA M      Created: Tue Jul 02, 2013 10:22 AM       Please inform patient all lab results were normal. I look forward to seeing her soon to place IUD ------

## 2013-07-08 ENCOUNTER — Telehealth: Payer: Self-pay | Admitting: Family Medicine

## 2013-07-08 NOTE — Telephone Encounter (Signed)
[facility].Rosevelt Luu, Rodena Medinobert Lee

## 2013-07-08 NOTE — Telephone Encounter (Signed)
Left message with patient's mother for her to return call. Need to confirm that she took the antibiotic sent in on Friday for positive STD, then fax form to [facility].Delorice Bannister, Rodena Medinobert Lee

## 2013-07-08 NOTE — Telephone Encounter (Signed)
Pt called and wanted Molly MaduroRobert to know that she is picking the medication up now and will start the medication today. jw

## 2013-07-18 ENCOUNTER — Ambulatory Visit: Payer: Medicare Other | Admitting: Family Medicine

## 2013-07-25 ENCOUNTER — Telehealth: Payer: Self-pay | Admitting: Family Medicine

## 2013-07-25 NOTE — Telephone Encounter (Signed)
Pt called and would like the nurse to call her. jw °

## 2013-07-25 NOTE — Telephone Encounter (Signed)
Left message for pt to return nurse call. Clovis PuMartin, Tamika L, RN

## 2013-12-03 ENCOUNTER — Telehealth: Payer: Self-pay | Admitting: Family Medicine

## 2013-12-03 NOTE — Telephone Encounter (Signed)
Pt called and would like to speak to Dr. Richarda BladeAdamo. I am not sure what about since I could not understand anything she was saying. I tried to ask several times and all I could gather is that she would like to speak to Dr. Richarda BladeAdamo. jw

## 2013-12-03 NOTE — Telephone Encounter (Signed)
Called pt and she needs a doctors note sent to social sercurity stating that she can handle her own money. Letter needs to be faxed to [phone]. Airyanna Dipalma CMA

## 2013-12-09 NOTE — Telephone Encounter (Signed)
Pt informed. Didn't want to make an appt over the phone. Will call back and make an appt and give social workers information. Blount, Deseree CMA

## 2013-12-09 NOTE — Telephone Encounter (Signed)
Please inform patient she will need to come in for a visit to discuss this issue further before a letter can be sent. I will also need contact information for her case worker at social [employer]. Thanks!

## 2013-12-16 ENCOUNTER — Ambulatory Visit: Payer: Medicare Other | Admitting: Family Medicine

## 2013-12-23 ENCOUNTER — Ambulatory Visit: Payer: Medicare Other | Admitting: Family Medicine

## 2014-01-16 ENCOUNTER — Ambulatory Visit: Payer: Medicare Other | Admitting: Family Medicine

## 2014-01-22 ENCOUNTER — Telehealth: Payer: Self-pay | Admitting: Family Medicine

## 2014-01-22 NOTE — Telephone Encounter (Signed)
Pt called and would ike to speak to a nurse concerning her period. jw

## 2014-01-22 NOTE — Telephone Encounter (Signed)
Pt called and would like to speak to a nurse concerning her period, jw

## 2014-01-23 NOTE — Telephone Encounter (Signed)
Pt is calling back and would like to speak to a nurse. jw

## 2014-01-23 NOTE — Telephone Encounter (Signed)
Pt states that her menstrual started earlier than usual.  It started on 01/09/14-last week.  States that her last menstrual was at the end of December.  Pt states that it is heavy at first and then gets lighter.  She states that she already has irregular periods. Concerns for pregnancy.  Appt made for 01/28/14.  Jazmin Hartsell,CMA

## 2014-01-27 ENCOUNTER — Ambulatory Visit: Payer: Medicare Other | Admitting: Family Medicine

## 2014-01-30 ENCOUNTER — Ambulatory Visit: Payer: Medicare Other | Admitting: Family Medicine

## 2014-02-06 ENCOUNTER — Ambulatory Visit: Payer: Medicare Other | Admitting: Family Medicine

## 2014-02-14 ENCOUNTER — Ambulatory Visit: Payer: Medicare Other | Admitting: Family Medicine

## 2014-02-26 ENCOUNTER — Telehealth: Payer: Self-pay | Admitting: Family Medicine

## 2014-02-26 NOTE — Telephone Encounter (Signed)
Spoke with pt regarding her menstrual cycle.  Pt stated her cycle is late; last month is came on 1/3-01/07/14.  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  Pt also stated she is concern for pregnancy.  Last time pt has sex was almost 2 weeks ago; unprotected.  Pt stated she is not on any birth control.  Pt advised she should schedule an appt for a physical to discuss her menstrual cycles and pregnancy.  Pt stated she will take home pregnancy test; if it is positive she will then schedule an appt.  Clovis PuMartin, Tamika L, RN

## 2014-02-26 NOTE — Telephone Encounter (Signed)
Pt needs to speak with RN about her menstrual cycles, has questions.

## 2014-02-27 ENCOUNTER — Encounter: Payer: Medicare Other | Admitting: Family Medicine

## 2014-03-07 ENCOUNTER — Ambulatory Visit: Payer: Medicare Other | Admitting: Family Medicine

## 2014-03-10 ENCOUNTER — Ambulatory Visit: Payer: Medicare Other | Admitting: Family Medicine

## 2014-05-09 ENCOUNTER — Ambulatory Visit: Payer: Medicare Other | Admitting: Family Medicine

## 2014-06-18 ENCOUNTER — Ambulatory Visit: Payer: Medicare Other | Admitting: Family Medicine

## 2014-07-10 ENCOUNTER — Telehealth: Payer: Self-pay | Admitting: Family Medicine

## 2014-07-10 NOTE — Telephone Encounter (Signed)
Please call patient back. Want to ask provider about a sexual issue.

## 2014-07-31 ENCOUNTER — Ambulatory Visit: Payer: Medicare Other | Admitting: Family Medicine

## 2014-08-08 ENCOUNTER — Ambulatory Visit: Payer: Medicare Other | Admitting: Family Medicine

## 2014-08-11 ENCOUNTER — Ambulatory Visit (INDEPENDENT_AMBULATORY_CARE_PROVIDER_SITE_OTHER): Payer: Medicare Other | Admitting: Obstetrics and Gynecology

## 2014-08-11 ENCOUNTER — Encounter: Payer: Self-pay | Admitting: Obstetrics and Gynecology

## 2014-08-11 ENCOUNTER — Other Ambulatory Visit (HOSPITAL_COMMUNITY)
Admission: RE | Admit: 2014-08-11 | Discharge: 2014-08-11 | Disposition: A | Discharge status: Home / Self Care | Payer: Medicare Other | Source: Ambulatory Visit | Attending: Family Medicine | Admitting: Family Medicine

## 2014-08-11 VITALS — BP 121/82 | HR 80 | Temp 98.6°F | Ht 65.0 in | Wt 228.0 lb

## 2014-08-11 DIAGNOSIS — Z113 Encounter for screening for infections with a predominantly sexual mode of transmission: Secondary | ICD-10-CM | POA: Diagnosis not present

## 2014-08-11 DIAGNOSIS — R358 Other polyuria: Secondary | ICD-10-CM

## 2014-08-11 DIAGNOSIS — N898 Other specified noninflammatory disorders of vagina: Secondary | ICD-10-CM

## 2014-08-11 DIAGNOSIS — Z7251 High risk heterosexual behavior: Secondary | ICD-10-CM

## 2014-08-11 DIAGNOSIS — R3589 Other polyuria: Secondary | ICD-10-CM

## 2014-08-11 LAB — POCT URINALYSIS DIPSTICK
Bilirubin, UA: NEGATIVE
Glucose, UA: NEGATIVE
KETONES UA: NEGATIVE
Leukocytes, UA: NEGATIVE
NITRITE UA: NEGATIVE
RBC UA: NEGATIVE
SPEC GRAV UA: 1.02
UROBILINOGEN UA: 2
pH, UA: 7.5

## 2014-08-11 LAB — POCT WET PREP (WET MOUNT): Clue Cells Wet Prep Whiff POC: POSITIVE

## 2014-08-11 LAB — POCT URINE PREGNANCY: PREG TEST UR: NEGATIVE

## 2014-08-11 NOTE — Patient Instructions (Signed)
Sexually Transmitted Disease A sexually transmitted disease (STD) is a disease or infection often passed to another person during sex. However, STDs can be passed through nonsexual ways. An STD can be passed through:  Spit (saliva).  Semen.  Blood.  Mucus from the vagina.  Pee (urine). HOW CAN I LESSEN MY CHANCES OF GETTING AN STD?  Use:  Latex condoms.  Water-soluble lubricants with condoms. Do not use petroleum jelly or oils.  Dental dams. These are small pieces of latex that are used as a barrier during oral sex.  Avoid having more than one sex partner.  Do not have sex with someone who has other sex partners.  Do not have sex with anyone you do not know or who is at high risk for an STD.  Avoid risky sex that can break your skin.  Do not have sex if you have open sores on your mouth or skin.  Avoid drinking too much alcohol or taking illegal drugs. Alcohol and drugs can affect your good judgment.  Avoid oral and anal sex acts.  Get shots (vaccines) for HPV and hepatitis.  If you are at risk of being infected with HIV, it is advised that you take a certain medicine daily to prevent HIV infection. This is called pre-exposure prophylaxis (PrEP). You may be at risk if:  You are a man who has sex with other men (MSM).  You are attracted to the opposite sex (heterosexual) and are having sex with more than one partner.  You take drugs with a needle.  You have sex with someone who has HIV.  Talk with your doctor about if you are at high risk of being infected with HIV. If you begin to take PrEP, get tested for HIV first. Get tested every 3 months for as long as you are taking PrEP. WHAT SHOULD I DO IF I THINK I HAVE AN STD?  See your doctor.  Tell your sex partner(s) that you have an STD. They should be tested and treated.  Do not have sex until your doctor says it is okay. WHEN SHOULD I GET HELP? Get help right away if:  You have bad belly (abdominal)  pain.  You are a man and have puffiness (swelling) or pain in your testicles.  You are a woman and have puffiness in your vagina. Document Released: 01/28/2004 Document Revised: 12/25/2012 Document Reviewed: 06/15/2012 ExitCare Patient Information 2015 [employer]. This information is not intended to replace advice given to you by your health care provider. Make sure you discuss any questions you have with your health care provider.  

## 2014-08-11 NOTE — Progress Notes (Signed)
   Subjective:   Patient ID: Kelli Hurst, female    DOB: September 04, 1985, 29 y.o.   MRN: [id number]  Patient presents for Same Day Appointment  CC: Unprotected intercourse concern for pregnancy  HPI: Patient states that about 2 weeks ago she had unprotected sex with her ex-boyfriend. This ex-boyfriend was just released from prison. Patient states that she didn't plan on having sex with him but she did. She does not want to continue a sexual relationship with him. She also admits to have intercourse with another ex-boyfriend a week or so prior to the last encounter which was also unprotected. Since that time she has been having an arrange of symptoms from increased appetite, frequent urination, irritable, fatigue, nausea, and back pain. She believes that she may be pregnant. She denies any birth control use. When patient asked if she was trying to get pregnant she did not answer but just put her head down.   LMP 07/24/2014  OB hx -[id number] - 2014 miscarriage  Symptoms Discharge: yes; h/o STDs Diarrhea: no No dysuria Abdominal pain: no Lightheadedness: yes Fever: no  Cigarette smoking occasional. No alcohol. Denies any other drugs  ROS see HPI Occasional tobacco use but not daily. Denies alcohol use or illict drugs.  Objective:  BP 121/82 mmHg  Pulse 80  Temp(Src) 98.6 F (37 C) (Oral)  Ht  (1.651 m)  Wt 228 lb (103.42 kg)  BMI 37.94 kg/m2  LMP 07/24/2014  Physical Exam  Constitutional: She is oriented to person, place, and time and well-developed, well-nourished, and in no distress.  Cardiovascular: Normal rate, regular rhythm, normal heart sounds and intact distal pulses.   Pulmonary/Chest: Effort normal and breath sounds normal.  Abdominal: Soft. Bowel sounds are normal. There is no tenderness.  Genitourinary: Vaginal discharge found.  Neurological: She is alert and oriented to person, place, and time.  Slow speech, delayed cognition  Skin: Skin is warm and dry.    Upreg- negative UA - unremarkable Wet prep - BV  Assessment & Plan:  See Problem List Documentation   Caryl Ada, DO 08/11/2014, 2:32 PM PGY-2, [facility]

## 2014-08-12 ENCOUNTER — Encounter: Payer: Self-pay | Admitting: Obstetrics and Gynecology

## 2014-08-12 ENCOUNTER — Other Ambulatory Visit: Payer: Self-pay | Admitting: Obstetrics and Gynecology

## 2014-08-12 DIAGNOSIS — Z7251 High risk heterosexual behavior: Secondary | ICD-10-CM | POA: Insufficient documentation

## 2014-08-12 LAB — GC/CHLAMYDIA PROBE AMP (~~LOC~~) NOT AT ARMC
Chlamydia: POSITIVE — AB
NEISSERIA GONORRHEA: POSITIVE — AB

## 2014-08-12 MED ORDER — METRONIDAZOLE 500 MG PO TABS: 500.0000 mg | ORAL_TABLET | Freq: Two times a day (BID) | ORAL | Status: DC

## 2014-08-12 NOTE — Assessment & Plan Note (Signed)
A: Upreg was negative. No concern for pregnancy at this time. Does have vaginal discharge and h/o STDs. Concern for this patient who appears to have some mild cognitive impairment that she may be getting taken advantage of or not fully knowing the consequences of her actions.   P: -Wet prep ordered showing BV - flagyl 7d course given along with counseling on avoiding alcohol -Obtained urine which was also unremarkable -Gc/Ch also obtained; will notify patient of results and treat as appropriate.  -extensive discussion with patient on consequences of unprotected sex and safe sex practices. -Patient should RTC to see PCP for further discussions on Birth control, safe sex,  and testing for HIV/RPR -handout given -RTC prn if symptoms not improved

## 2014-08-12 NOTE — Progress Notes (Signed)
Quick Note:  Discussed results with patient over the phone. She will come to clinic tomorrow to receive treatment for Gc/Ch. Flagyl was sent to pharmacy for BV. ______

## 2014-08-13 ENCOUNTER — Ambulatory Visit (INDEPENDENT_AMBULATORY_CARE_PROVIDER_SITE_OTHER): Payer: Medicare Other | Admitting: *Deleted

## 2014-08-13 ENCOUNTER — Encounter: Payer: Self-pay | Admitting: Obstetrics and Gynecology

## 2014-08-13 DIAGNOSIS — A549 Gonococcal infection, unspecified: Secondary | ICD-10-CM

## 2014-08-13 DIAGNOSIS — A749 Chlamydial infection, unspecified: Secondary | ICD-10-CM

## 2014-08-13 MED ORDER — CEFTRIAXONE SODIUM 250 MG IJ SOLR
250.0000 mg | Freq: Once | INTRAMUSCULAR | Status: AC
  Administered 2014-08-13: 250 mg via INTRAMUSCULAR

## 2014-08-13 MED ORDER — AZITHROMYCIN 500 MG PO TABS
1000.0000 mg | ORAL_TABLET | Freq: Once | ORAL | Status: AC
  Administered 2014-08-13: 1000 mg via ORAL

## 2014-08-13 NOTE — Progress Notes (Signed)
Patient with positive Gc/Ch screening. Discussed results with her over the phone. She is to come to clinic for treatment with 1g Po Azithromycin and CTX  IM once. Will make Tamika, RN and PCP aware.

## 2014-08-13 NOTE — Progress Notes (Signed)
   Pt in nurse clinic for gonorrhea and chlamydia treatment.  Rocephin 250 mg IM x 1 and Azithromycin 1 GM PO x 1 given by verbal order Dr. Doroteo Glassman.  Injection given LUOQ.  Pt advised to avoid sex x 2 week and until partner has been tested/treated.  Clovis Pu, RN

## 2014-08-15 ENCOUNTER — Telehealth: Payer: Self-pay | Admitting: Family Medicine

## 2014-08-15 NOTE — Telephone Encounter (Signed)
Pt is requesting to speak with pcp re: her test results, has questions

## 2014-08-19 NOTE — Telephone Encounter (Signed)
Returned call, all questions answered.

## 2014-09-11 ENCOUNTER — Encounter: Payer: Self-pay | Admitting: Family Medicine

## 2014-09-11 ENCOUNTER — Ambulatory Visit (INDEPENDENT_AMBULATORY_CARE_PROVIDER_SITE_OTHER): Payer: Medicare Other | Admitting: Family Medicine

## 2014-09-11 VITALS — BP 128/84 | HR 78 | Temp 98.4°F | Ht 65.0 in | Wt 233.0 lb

## 2014-09-11 DIAGNOSIS — Z23 Encounter for immunization: Secondary | ICD-10-CM | POA: Diagnosis not present

## 2014-09-11 DIAGNOSIS — Z0000 Encounter for general adult medical examination without abnormal findings: Secondary | ICD-10-CM

## 2014-09-11 DIAGNOSIS — E669 Obesity, unspecified: Secondary | ICD-10-CM | POA: Diagnosis not present

## 2014-09-11 DIAGNOSIS — N915 Oligomenorrhea, unspecified: Secondary | ICD-10-CM

## 2014-09-11 DIAGNOSIS — E282 Polycystic ovarian syndrome: Secondary | ICD-10-CM | POA: Diagnosis not present

## 2014-09-11 DIAGNOSIS — Z01419 Encounter for gynecological examination (general) (routine) without abnormal findings: Secondary | ICD-10-CM

## 2014-09-11 LAB — POCT GLYCOSYLATED HEMOGLOBIN (HGB A1C): HEMOGLOBIN A1C: 5.4

## 2014-09-11 MED ORDER — METFORMIN HCL ER 500 MG PO TB24: 500.0000 mg | ORAL_TABLET | Freq: Every day | ORAL | Status: DC

## 2014-09-11 NOTE — Patient Instructions (Signed)
Diabetes Mellitus and Food It is important for you to manage your blood sugar (glucose) level. Your blood glucose level can be greatly affected by what you eat. Eating healthier foods in the appropriate amounts throughout the day at about the same time each day will help you control your blood glucose level. It can also help slow or prevent worsening of your diabetes mellitus. Healthy eating may even help you improve the level of your blood pressure and reach or maintain a healthy weight.  HOW CAN FOOD AFFECT ME? Carbohydrates Carbohydrates affect your blood glucose level more than any other type of food. Your dietitian will help you determine how many carbohydrates to eat at each meal and teach you how to count carbohydrates. Counting carbohydrates is important to keep your blood glucose at a healthy level, especially if you are using insulin or taking certain medicines for diabetes mellitus. Alcohol Alcohol can cause sudden decreases in blood glucose (hypoglycemia), especially if you use insulin or take certain medicines for diabetes mellitus. Hypoglycemia can be a life-threatening condition. Symptoms of hypoglycemia (sleepiness, dizziness, and disorientation) are similar to symptoms of having too much alcohol.  If your health care provider has given you approval to drink alcohol, do so in moderation and use the following guidelines:  Women should not have more than one drink per day, and men should not have more than two drinks per day. One drink is equal to:  12 oz of beer.  5 oz of wine.  1 oz of hard liquor.  Do not drink on an empty stomach.  Keep yourself hydrated. Have water, diet soda, or unsweetened iced tea.  Regular soda, juice, and other mixers might contain a lot of carbohydrates and should be counted. WHAT FOODS ARE NOT RECOMMENDED? As you make food choices, it is important to remember that all foods are not the same. Some foods have fewer nutrients per serving than other  foods, even though they might have the same number of calories or carbohydrates. It is difficult to get your body what it needs when you eat foods with fewer nutrients. Examples of foods that you should avoid that are high in calories and carbohydrates but low in nutrients include:  Trans fats (most processed foods list trans fats on the Nutrition Facts label).  Regular soda.  Juice.  Candy.  Sweets, such as cake, pie, doughnuts, and cookies.  Fried foods. WHAT FOODS CAN I EAT? Have nutrient-rich foods, which will nourish your body and keep you healthy. The food you should eat also will depend on several factors, including:  The calories you need.  The medicines you take.  Your weight.  Your blood glucose level.  Your blood pressure level.  Your cholesterol level. You also should eat a variety of foods, including:  Protein, such as meat, poultry, fish, tofu, nuts, and seeds (lean animal proteins are best).  Fruits.  Vegetables.  Dairy products, such as milk, cheese, and yogurt (low fat is best).  Breads, grains, pasta, cereal, rice, and beans.  Fats such as olive oil, trans fat-free margarine, canola oil, avocado, and olives. DOES EVERYONE WITH DIABETES MELLITUS HAVE THE SAME MEAL PLAN? Because every person with diabetes mellitus is different, there is not one meal plan that works for everyone. It is very important that you meet with a dietitian who will help you create a meal plan that is just right for you. Document Released: 09/16/2004 Document Revised: 12/25/2012 Document Reviewed: 11/16/2012 ExitCare Patient Information 2015 [employer]. This   information is not intended to replace advice given to you by your health care provider. Make sure you discuss any questions you have with your health care provider.  

## 2014-09-12 LAB — DHEA-SULFATE: DHEA-SO4: 234 ug/dL (ref 18–391)

## 2014-09-12 LAB — TSH: TSH: 0.916 u[IU]/mL (ref 0.350–4.500)

## 2014-09-12 LAB — TESTOSTERONE, FREE, TOTAL, SHBG
Sex Hormone Binding: 46 nmol/L (ref 17–124)
TESTOSTERONE: 58 ng/dL (ref 10–70)
Testosterone, Free: 8.5 pg/mL — ABNORMAL HIGH (ref 0.6–6.8)
Testosterone-% Free: 1.5 % (ref 0.4–2.4)

## 2014-09-12 LAB — FOLLICLE STIMULATING HORMONE: FSH: 5.8 m[IU]/mL

## 2014-09-12 LAB — BASIC METABOLIC PANEL
BUN: 10 mg/dL (ref 7–25)
CALCIUM: 9.3 mg/dL (ref 8.6–10.2)
CO2: 25 mmol/L (ref 20–31)
Chloride: 99 mmol/L (ref 98–110)
Creat: 0.7 mg/dL (ref 0.50–1.10)
Glucose, Bld: 79 mg/dL (ref 65–99)
POTASSIUM: 3.8 mmol/L (ref 3.5–5.3)
SODIUM: 137 mmol/L (ref 135–146)

## 2014-09-12 LAB — LIPID PANEL
CHOL/HDL RATIO: 3.4 ratio (ref <=5.0)
Cholesterol: 162 mg/dL (ref 125–200)
HDL: 48 mg/dL (ref >=46)
LDL Cholesterol: 100 mg/dL (ref <130)
Triglycerides: 70 mg/dL (ref <150)
VLDL: 14 mg/dL (ref <30)

## 2014-09-12 LAB — LUTEINIZING HORMONE: LH: 8.3 m[IU]/mL

## 2014-09-12 LAB — PROLACTIN: PROLACTIN: 11 ng/mL

## 2014-09-19 NOTE — Assessment & Plan Note (Signed)
Hirsutism, irregular periods and infertility - send labs today: testosterone, DHEA, FSH, LH, TSH, A1c, lipids, BMP, prolactin - start metformin - f/u in 1 month

## 2014-09-19 NOTE — Progress Notes (Signed)
   Subjective:   Kelli Hurst is a 29 y.o. female with a history of HLD, obesity and irregular cycles here for well woman check  Pt reports she is doing well but stopped taking her birth control over a year ago because she wants to get pregnant and still has not. She has been having regular intercourse throughout this time. Her periods are very irregular and she is unable to describe them well. She often skips 2-3 months at a time. She does have symptoms of cramping and mood changes before they come on. They usually last about a week and are not too heavy.  She has a lot of hair growth on her chin that she has to shave. She has not noticed any skin or hair changes and has normal heat and cold tolerance.  Review of Systems:  Per HPI. All other systems reviewed and are negative.   PMH, PSH, Medications, Allergies, and FmHx reviewed and updated in EMR.  Social History: current smoker  Objective:  BP 128/84 mmHg  Pulse 78  Temp(Src) 98.4 F (36.9 C) (Oral)  Ht  (1.651 m)  Wt 233 lb (105.688 kg)  BMI 38.77 kg/m2  LMP  (LMP Unknown)  Gen:  29 y.o. female in NAD HEENT: NCAT, MMM, EOMI, PERRL, anicteric sclerae CV: RRR, no MRG, no JVD Resp: Non-labored, CTAB, no wheezes noted Abd: Soft, NTND, BS present, no guarding or organomegaly Ext: WWP, no edema MSK: Full ROM, strength intact Neuro: Alert and oriented, speech normal      Chemistry      Component Value Date/Time   NA 137 09/11/2014 1521   K 3.8 09/11/2014 1521   CL 99 09/11/2014 1521   CO2 25 09/11/2014 1521   BUN 10 09/11/2014 1521   CREATININE 0.70 09/11/2014 1521   CREATININE 0.83 08/01/2008 2011      Component Value Date/Time   CALCIUM 9.3 09/11/2014 1521   ALKPHOS 87 04/23/2007 2038   AST 14 04/23/2007 2038   ALT 12 04/23/2007 2038   BILITOT 0.4 04/23/2007 2038      Lab Results  Component Value Date   WBC 8.0 07/30/2010   HGB 12.5 07/30/2010   HCT 37.3 07/30/2010   MCV 86.1 07/30/2010   PLT 405*  07/30/2010   Lab Results  Component Value Date   TSH 0.916 09/11/2014   Lab Results  Component Value Date   HGBA1C 5.4 09/11/2014   Assessment & Plan:     Kelli Hurst is a 29 y.o. female here for well woman/pcos. Health maintenance UTD with flu given today.  PCOS (polycystic ovarian syndrome) Hirsutism, irregular periods and infertility - send labs today: testosterone, DHEA, FSH, LH, TSH, A1c, lipids, BMP, prolactin - start metformin - f/u in 1 month    Beverely Low, MD, MPH [facility] PGY-3 09/19/2014 3:24 PM

## 2014-10-13 ENCOUNTER — Ambulatory Visit: Payer: Medicare Other | Admitting: Family Medicine

## 2014-10-30 ENCOUNTER — Telehealth (HOSPITAL_COMMUNITY): Payer: Self-pay | Admitting: Nurse Practitioner

## 2014-10-30 NOTE — Telephone Encounter (Signed)
error 

## 2014-11-12 ENCOUNTER — Telehealth: Payer: Self-pay | Admitting: Family Medicine

## 2014-11-12 NOTE — Telephone Encounter (Signed)
Spoke with patient, she reports have multiple vague symptoms (nausea, low back pain, body aches) that she thinks may be related to pregnancy. Scheduled on SDA tomorrow.

## 2014-11-12 NOTE — Telephone Encounter (Signed)
Wants nurse to call her back--has a question about pregnancy

## 2014-11-13 ENCOUNTER — Ambulatory Visit: Payer: Medicare Other | Admitting: Family Medicine

## 2014-11-19 ENCOUNTER — Ambulatory Visit: Payer: Medicare Other | Admitting: Family Medicine

## 2014-12-22 ENCOUNTER — Telehealth: Payer: Self-pay | Admitting: Family Medicine

## 2014-12-22 NOTE — Telephone Encounter (Signed)
Wants to get pregnant.  She came on her cyle.  This is the second month of trying. Wants to know if there is another way she can get pregnant

## 2014-12-24 NOTE — Telephone Encounter (Signed)
Will need visit to discuss fertility issues. Thanks

## 2014-12-25 NOTE — Telephone Encounter (Signed)
No answer and no machine.  Has appt on 01/06/15. Fleeger, Maryjo RochesterJessica Dawn

## 2015-01-06 ENCOUNTER — Ambulatory Visit: Payer: Medicare Other | Admitting: Family Medicine

## 2015-01-06 ENCOUNTER — Ambulatory Visit (INDEPENDENT_AMBULATORY_CARE_PROVIDER_SITE_OTHER): Payer: Medicare Other | Admitting: Family Medicine

## 2015-01-06 ENCOUNTER — Encounter: Payer: Self-pay | Admitting: Family Medicine

## 2015-01-06 VITALS — BP 130/95 | HR 83 | Temp 97.9°F | Ht 65.0 in | Wt 232.4 lb

## 2015-01-06 DIAGNOSIS — Z0279 Encounter for issue of other medical certificate: Secondary | ICD-10-CM

## 2015-01-06 NOTE — Assessment & Plan Note (Signed)
Patient needs letter to receive her SS checks directly, passed mini mental today, appears to have capacity, states she will use money to pay her bills, able to state what they are and how much - letter written

## 2015-01-06 NOTE — Progress Notes (Signed)
   Subjective:   Kelli Hurst is a 30 y.o. female with a history of pcos here for eval of decision making  Patient presents for letter stating she can make her own financial decisions. According to the patient she has never had a formal evaluation or any testing indicating she lacked capacity but her mother told social services that she was incompetent and so her mom is still getting her check and paying her bills for her. She is engaged (fiance present at visit) and they would like to pay their own bills. She states that she will use the funds to pay her bills and is able to state what they are and approximate cost per month.   Review of Systems:  Per HPI. All other systems reviewed and are negative.   PMH, PSH, Medications, Allergies, and FmHx reviewed and updated in EMR.  Social History: occasional smoker  Objective:  BP 130/95 mmHg  Pulse 83  Temp(Src) 97.9 F (36.6 C) (Oral)  Ht 5\' 5"  (1.651 m)  Wt 232 lb 6.4 oz (105.416 kg)  BMI 38.67 kg/m2  LMP 12/20/2014  Gen:  30 y.o. female in NAD HEENT: NCAT, MMM, EOMI, PERRL, anicteric sclerae CV: RRR, no MRG, no JVD Resp: Non-labored, CTAB, no wheezes noted Abd: Soft, NTND, BS present, no guarding or organomegaly Ext: WWP, no edema MSK: Full ROM, strength intact Neuro: Alert and oriented, speech normal Psych: normal insight and behavior. MMSE score 30/30 but some trouble with serial sevens and clock drawing      Chemistry      Component Value Date/Time   NA 137 09/11/2014 1521   K 3.8 09/11/2014 1521   CL 99 09/11/2014 1521   CO2 25 09/11/2014 1521   BUN 10 09/11/2014 1521   CREATININE 0.70 09/11/2014 1521   CREATININE 0.83 08/01/2008 2011      Component Value Date/Time   CALCIUM 9.3 09/11/2014 1521   ALKPHOS 87 04/23/2007 2038   AST 14 04/23/2007 2038   ALT 12 04/23/2007 2038   BILITOT 0.4 04/23/2007 2038      Lab Results  Component Value Date   WBC 8.0 07/30/2010   HGB 12.5 07/30/2010   HCT 37.3 07/30/2010   MCV 86.1 07/30/2010   PLT 405* 07/30/2010   Lab Results  Component Value Date   TSH 0.916 09/11/2014   Lab Results  Component Value Date   HGBA1C 5.4 09/11/2014   Assessment & Plan:     Kelli Hurst is a 30 y.o. female here for decision making eval  Encounter for evaluation of ability to make decisions regarding care Patient needs letter to receive her SS checks directly, passed mini mental today, appears to have capacity, states she will use money to pay her bills, able to state what they are and how much - letter written - seems to hhave below average intelligence but has adequate insight and no reason to believe she lacks decision-making capacity     Beverely LowElena Oma Marzan, MD, MPH [facility] PGY-3 01/06/2015 8:57 PM

## 2015-01-23 ENCOUNTER — Telehealth: Payer: Self-pay | Admitting: Family Medicine

## 2015-01-23 NOTE — Telephone Encounter (Signed)
Return call to patient regarding the metformin.  Patient stated she is taking the medication for PCOS.  Patient is really having concerns that the medication is not helping her conceive.  Last menstrual period 01/17/15-01/22/15.  Will forward to PCP for further advise.  Please give patient call to discuss other options.  Clovis Pu, RN

## 2015-01-23 NOTE — Telephone Encounter (Signed)
Wants to talk to nurse about metformin she is taking

## 2015-01-26 NOTE — Telephone Encounter (Signed)
She should give it at least 6 months of taking it consistently and then if it still doesn't work we will talk about other options.

## 2015-01-30 NOTE — Telephone Encounter (Signed)
Patient informed, expressed understanding. 

## 2015-02-19 ENCOUNTER — Ambulatory Visit: Payer: Medicare Other | Admitting: Family Medicine

## 2015-03-11 ENCOUNTER — Encounter (HOSPITAL_COMMUNITY): Payer: Self-pay | Admitting: Emergency Medicine

## 2015-03-11 ENCOUNTER — Emergency Department (HOSPITAL_COMMUNITY)
Admission: EM | Admit: 2015-03-11 | Discharge: 2015-03-11 | Disposition: A | Discharge status: Home / Self Care | Payer: Medicare Other | Attending: Emergency Medicine | Admitting: Emergency Medicine

## 2015-03-11 ENCOUNTER — Emergency Department (HOSPITAL_COMMUNITY): Payer: Medicare Other

## 2015-03-11 DIAGNOSIS — E669 Obesity, unspecified: Secondary | ICD-10-CM | POA: Diagnosis not present

## 2015-03-11 DIAGNOSIS — F17210 Nicotine dependence, cigarettes, uncomplicated: Secondary | ICD-10-CM | POA: Insufficient documentation

## 2015-03-11 DIAGNOSIS — J209 Acute bronchitis, unspecified: Secondary | ICD-10-CM | POA: Diagnosis not present

## 2015-03-11 DIAGNOSIS — R05 Cough: Secondary | ICD-10-CM | POA: Diagnosis present

## 2015-03-11 DIAGNOSIS — Z7984 Long term (current) use of oral hypoglycemic drugs: Secondary | ICD-10-CM | POA: Insufficient documentation

## 2015-03-11 DIAGNOSIS — J40 Bronchitis, not specified as acute or chronic: Secondary | ICD-10-CM

## 2015-03-11 MED ORDER — BENZONATATE 100 MG PO CAPS: 100.0000 mg | ORAL_CAPSULE | Freq: Three times a day (TID) | ORAL | Status: DC | PRN

## 2015-03-11 MED ORDER — BENZONATATE 100 MG PO CAPS
100.0000 mg | ORAL_CAPSULE | Freq: Once | ORAL | Status: AC
  Administered 2015-03-11: 100 mg via ORAL
  Filled 2015-03-11: qty 1

## 2015-03-11 MED ORDER — IBUPROFEN 400 MG PO TABS
800.0000 mg | ORAL_TABLET | Freq: Once | ORAL | Status: AC
  Administered 2015-03-11: 800 mg via ORAL
  Filled 2015-03-11: qty 2

## 2015-03-11 MED ORDER — ALBUTEROL SULFATE HFA 108 (90 BASE) MCG/ACT IN AERS: 2.0000 | INHALATION_SPRAY | RESPIRATORY_TRACT | Status: DC | PRN

## 2015-03-11 MED ORDER — AZITHROMYCIN 250 MG PO TABS: 250.0000 mg | ORAL_TABLET | Freq: Every day | ORAL | Status: DC

## 2015-03-11 NOTE — ED Notes (Signed)
Patient able to ambulate independently  

## 2015-03-11 NOTE — ED Notes (Addendum)
Pt. reports persistent dry cough for 2 weeks with chest congestion and runny nose /fatigue , denies fever or chills.

## 2015-03-11 NOTE — ED Provider Notes (Signed)
CSN: [id number]     Arrival date & time 03/11/15  2044 History  By signing my name below, I, Kelli Hurst, attest that this documentation has been prepared under the direction and in the presence of [name], PA-C. Electronically Signed: [name], ED Scribe 03/11/2015 at 9:47 PM.    Chief Complaint  Patient presents with  . Cough    The history is provided by the patient and a caregiver. No language interpreter was used.   HPI Comments: Kelli Hurst is a 30 y.o. female who presents to the Emergency Department complaining of gradual onset, constant productive cough with white phlegm, onset about two weeks ago. Pt reports associated sore throat, shortness of breath when coughing, resolved fever. She has had positive sick contact at her place of work. Pt has taken OTC medication with mild relief to symptoms. No other worsening or alleviating factors noted. Pt denies otalgia, CP, nausea, vomiting, myalgia, unilateral leg swelling, or any other pertinent symptoms.   Past Medical History  Diagnosis Date  . Obesity    Past Surgical History  Procedure Laterality Date  . Flat feet repaired     Family History  Problem Relation Age of Onset  . Diabetes Father    Social History  Substance Use Topics  . Smoking status: Current Some Day Smoker    Types: Cigarettes  . Smokeless tobacco: Never Used  . Alcohol Use: No   OB History    Gravida Para Term Preterm AB TAB SAB Ectopic Multiple Living   1 0 0 0 1  1   0     Review of Systems  Constitutional: Positive for fever.  HENT: Negative for ear pain.   Respiratory: Positive for cough and shortness of breath (when coughing).   Cardiovascular: Negative for chest pain and leg swelling.  Gastrointestinal: Negative for nausea and vomiting.  Musculoskeletal: Negative for myalgias.  All other systems reviewed and are negative.     Allergies  Review of patient's allergies indicates no known allergies.  Home Medications    Prior to Admission medications   Medication Sig Start Date End Date Taking? Authorizing Provider  metFORMIN (GLUCOPHAGE XR) 500 MG 24 hr tablet Take 1 tablet (500 mg total) by mouth daily with breakfast. 09/11/14   Abram SanderElena M Adamo, MD   BP 140/90 mmHg  Pulse 102  Temp(Src) 98.6 F (37 C) (Oral)  Resp 20  Ht 5\' 6"  (1.676 m)  Wt 232 lb (105.235 kg)  BMI 37.46 kg/m2  SpO2 100%  LMP 02/12/2015 (Approximate) Physical Exam  Constitutional: She is oriented to person, place, and time. She appears well-developed and well-nourished.  Non-toxic appearance. She does not have a sickly appearance. She does not appear ill.  HENT:  Head: Normocephalic and atraumatic.  Right Ear: Tympanic membrane and external ear normal.  Left Ear: Tympanic membrane and external ear normal.  Nose: Rhinorrhea present.  Mouth/Throat: Uvula is midline, oropharynx is clear and moist and mucous membranes are normal. No oropharyngeal exudate, posterior oropharyngeal edema, posterior oropharyngeal erythema or tonsillar abscesses.  Eyes: Conjunctivae are normal.  Neck: Normal range of motion. Neck supple.  No nuchal rigidity.  Cardiovascular: Normal rate, regular rhythm and normal heart sounds.   No murmur heard. Pulmonary/Chest: Effort normal and breath sounds normal. No accessory muscle usage or stridor. No respiratory distress. She has no wheezes. She has no rhonchi. She has no rales.  Abdominal: Soft. Bowel sounds are normal. She exhibits no distension. There is no tenderness. There  is no rebound and no guarding.  Musculoskeletal: Normal range of motion.  Lymphadenopathy:    She has no cervical adenopathy.  Neurological: She is alert and oriented to person, place, and time.  Speech clear without dysarthria.  Skin: Skin is warm and dry.  Psychiatric: She has a normal mood and affect. Her behavior is normal.    ED Course  Procedures (including critical care time) DIAGNOSTIC STUDIES: Oxygen Saturation is 100% on  RA, normal by my interpretation.    COORDINATION OF CARE: 9:12 PM-Discussed treatment plan which includes CXR and ibuprofen with pt at bedside and pt agreed to plan.    Labs Review Labs Reviewed - No data to display  Imaging Review Dg Chest 2 View  03/11/2015  CLINICAL DATA:  Cough and chest congestion for several days. EXAM: CHEST  2 VIEW COMPARISON:  None. FINDINGS: The heart size and mediastinal contours are within normal limits. Both lungs are clear. The visualized skeletal structures are unremarkable. IMPRESSION: No active cardiopulmonary disease. Electronically Signed   By: Myles Rosenthal M.D.   On: 03/11/2015 21:44   I have personally reviewed and evaluated these images and lab results as part of my medical decision-making.   EKG Interpretation None      MDM   Final diagnoses:  Bronchitis   Likely bronchitis.  VSS, NAD. Heart RRR, lungs CTAB, abdomen soft and benign.  HENT exam unremarkable.  Patient given ibuprofen and tessalon perle in ED.  CXR negative.  Plan to discharge home with albuterol, azithromycin, and tessalon perle.  Discussed return precautions.  Patient agrees and acknowledges the above plan for discharge.   I personally performed the services described in this documentation, which was scribed in my presence. The recorded information has been reviewed and is accurate.      Cheri Fowler, PA-C 03/11/15 2150  Zadie Rhine, MD 03/12/15 [phone]

## 2015-03-11 NOTE — Discharge Instructions (Signed)

## 2015-03-27 ENCOUNTER — Telehealth: Payer: Self-pay | Admitting: Family Medicine

## 2015-04-06 ENCOUNTER — Ambulatory Visit: Payer: Medicare Other | Admitting: Family Medicine

## 2015-04-13 ENCOUNTER — Ambulatory Visit: Payer: Medicare Other | Admitting: Family Medicine

## 2015-06-05 ENCOUNTER — Emergency Department (HOSPITAL_COMMUNITY)
Admission: EM | Admit: 2015-06-05 | Discharge: 2015-06-05 | Disposition: A | Discharge status: Home / Self Care | Payer: Medicare Other | Attending: Emergency Medicine | Admitting: Emergency Medicine

## 2015-06-05 ENCOUNTER — Encounter (HOSPITAL_COMMUNITY): Payer: Self-pay | Admitting: Emergency Medicine

## 2015-06-05 DIAGNOSIS — F17210 Nicotine dependence, cigarettes, uncomplicated: Secondary | ICD-10-CM | POA: Insufficient documentation

## 2015-06-05 DIAGNOSIS — R21 Rash and other nonspecific skin eruption: Secondary | ICD-10-CM | POA: Insufficient documentation

## 2015-06-05 DIAGNOSIS — J45909 Unspecified asthma, uncomplicated: Secondary | ICD-10-CM | POA: Diagnosis not present

## 2015-06-05 DIAGNOSIS — E119 Type 2 diabetes mellitus without complications: Secondary | ICD-10-CM | POA: Insufficient documentation

## 2015-06-05 LAB — CBG MONITORING, ED: Glucose-Capillary: 85 mg/dL (ref 65–99)

## 2015-06-05 MED ORDER — PREDNISONE 10 MG PO TABS: 20.0000 mg | ORAL_TABLET | Freq: Every day | ORAL | Status: DC

## 2015-06-05 MED ORDER — PREDNISONE 20 MG PO TABS
60.0000 mg | ORAL_TABLET | Freq: Once | ORAL | Status: AC
  Administered 2015-06-05: 60 mg via ORAL
  Filled 2015-06-05: qty 3

## 2015-06-05 MED ORDER — CEPHALEXIN 500 MG PO CAPS: 500.0000 mg | ORAL_CAPSULE | Freq: Two times a day (BID) | ORAL | Status: DC

## 2015-06-05 NOTE — ED Notes (Signed)
Pt departed in NAD.  

## 2015-06-05 NOTE — ED Provider Notes (Signed)
CSN: [id number]     Arrival date & time 06/05/15  0417 History   First MD Initiated Contact with Patient 06/05/15 0423     Chief Complaint  Patient presents with  . Rash     (Consider location/radiation/quality/duration/timing/severity/associated sxs/prior Treatment) HPI   Patient to the ER with PMH obesity, diabetes (no longer takes Metformin- states she eats healthy now) and asthma with complaint of itchy rash to arm, breast, abdomen for 1 or closer to two months. She says that the hives are worse on her abdomen. She denies having fevers, N/V/D, no oozing, scabbing, flaking or scaling noted. She denies that anyone else has this rash. She did not have the rash evaluated until now because it only just now started to itch. He has not had any vaginal bleeding or discharge and denies having any lesions on her genitalia buttocks. She has not had any headache, fevers, joint swelling, myalgias, arthralgias, tick bites that she's noticed. The rash is the worst on her stretch marks on her abdomen.  Past Medical History  Diagnosis Date  . Obesity    Past Surgical History  Procedure Laterality Date  . Flat feet repaired     Family History  Problem Relation Age of Onset  . Diabetes Father    Social History  Substance Use Topics  . Smoking status: Current Some Day Smoker    Types: Cigarettes  . Smokeless tobacco: Never Used  . Alcohol Use: No   OB History    Gravida Para Term Preterm AB TAB SAB Ectopic Multiple Living   1 0 0 0 1  1   0     Review of Systems  Review of Systems All other systems negative except as documented in the HPI. All pertinent positives and negatives as reviewed in the HPI.   Allergies  Review of patient's allergies indicates no known allergies.  Home Medications   Prior to Admission medications   Not on File   BP 129/80 mmHg  Pulse 89  Temp(Src) 97.8 F (36.6 C) (Oral)  Resp 18  Ht 5\' 6"  (1.676 m)  Wt 105.235 kg  BMI 37.46 kg/m2  SpO2 98%  LMP  05/04/2015 Physical Exam  Constitutional: She appears well-developed and well-nourished. No distress.  HENT:  Head: Normocephalic and atraumatic.  Eyes: Pupils are equal, round, and reactive to light.  Neck: Normal range of motion. Neck supple.  Cardiovascular: Normal rate and regular rhythm.   Pulmonary/Chest: Effort normal.  Abdominal: Soft.  Neurological: She is alert.  Skin: Skin is warm and dry. Rash noted. No abrasion, no burn, no ecchymosis, no laceration and no purpura noted. Rash is urticarial. Rash is not macular, not papular, not maculopapular, not nodular, not pustular and not vesicular.  Urticarial like lesions to bilateral upper arms, chest and abdomen. Without signs of excoriation.  Nursing note and vitals reviewed.   ED Course  Procedures (including critical care time) Labs Review Labs Reviewed  CBG MONITORING, ED    Imaging Review No results found. I have personally reviewed and evaluated these images and lab results as part of my medical decision-making.   EKG Interpretation None      MDM   Final diagnoses:  Rash    CBG checked in the emergency department at 85. Start patient on a steroid Dosepak. Also give her a prescription for Keflex. The patient will return on Sunday to be followed up by me if the rash is not significantly improving. At which point I will do  blood work, CBC, BMP, RPR and try different medication. At this point I have very low suspicion for syphilis or the rash being related to autoimmune disease.  Medications - No data to display  I discussed results, diagnoses and plan with Osborne Casco. They voice there understanding and questions were answered. We discussed follow-up recommendations and return precautions.     Marlon Pel, PA-C 06/05/15 0548  Marlon Pel, PA-C 06/05/15 1610  Tomasita Crumble, MD 06/05/15 1407

## 2015-06-05 NOTE — Discharge Instructions (Signed)
Hives Hives are itchy, red, swollen areas of the skin. They can vary in size and location on your body. Hives can come and go for hours or several days (acute hives) or for several weeks (chronic hives). Hives do not spread from person to person (noncontagious). They may get worse with scratching, exercise, and emotional stress. CAUSES   Allergic reaction to food, additives, or drugs.  Infections, including the common cold.  Illness, such as vasculitis, lupus, or thyroid disease.  Exposure to sunlight, heat, or cold.  Exercise.  Stress.  Contact with chemicals. SYMPTOMS   Red or white swollen patches on the skin. The patches may change size, shape, and location quickly and repeatedly.  Itching.  Swelling of the hands, feet, and face. This may occur if hives develop deeper in the skin. DIAGNOSIS  Your caregiver can usually tell what is wrong by performing a physical exam. Skin or blood tests may also be done to determine the cause of your hives. In some cases, the cause cannot be determined. TREATMENT  Mild cases usually get better with medicines such as antihistamines. Severe cases may require an emergency epinephrine injection. If the cause of your hives is known, treatment includes avoiding that trigger.  HOME CARE INSTRUCTIONS   Avoid causes that trigger your hives.  Take antihistamines as directed by your caregiver to reduce the severity of your hives. Non-sedating or low-sedating antihistamines are usually recommended. Do not drive while taking an antihistamine.  Take any other medicines prescribed for itching as directed by your caregiver.  Wear loose-fitting clothing.  Keep all follow-up appointments as directed by your caregiver. SEEK MEDICAL CARE IF:   You have persistent or severe itching that is not relieved with medicine.  You have painful or swollen joints. SEEK IMMEDIATE MEDICAL CARE IF:   You have a fever.  Your tongue or lips are swollen.  You have  trouble breathing or swallowing.  You feel tightness in the throat or chest.  You have abdominal pain. These problems may be the first sign of a life-threatening allergic reaction. Call your local emergency services (911 in [place] MAKE SURE YOU:   Understand these instructions.  Will watch your condition.  Will get help right away if you are not doing well or get worse.   This information is not intended to replace advice given to you by your health care provider. Make sure you discuss any questions you have with your health care provider.   Document Released: 12/20/2004 Document Revised: 12/25/2012 Document Reviewed: 03/15/2011 Elsevier Interactive Patient Education 2016 Elsevier Inc.  

## 2015-06-05 NOTE — ED Notes (Signed)
Pt arrives by POV with c/o itching rash on arms, breasts, and abdomen for about 1 month. Pt has scattered red hives on arms, and breasts with a greater concentration on abdomen. No oozing, scabbing, flaking or scales noted.

## 2015-06-07 ENCOUNTER — Emergency Department (HOSPITAL_COMMUNITY)
Admission: EM | Admit: 2015-06-07 | Discharge: 2015-06-07 | Disposition: A | Discharge status: Home / Self Care | Payer: Medicare Other | Attending: Emergency Medicine | Admitting: Emergency Medicine

## 2015-06-07 ENCOUNTER — Encounter (HOSPITAL_COMMUNITY): Payer: Self-pay | Admitting: Nurse Practitioner

## 2015-06-07 DIAGNOSIS — L509 Urticaria, unspecified: Secondary | ICD-10-CM | POA: Diagnosis not present

## 2015-06-07 DIAGNOSIS — E669 Obesity, unspecified: Secondary | ICD-10-CM | POA: Insufficient documentation

## 2015-06-07 DIAGNOSIS — Z6837 Body mass index (BMI) 37.0-37.9, adult: Secondary | ICD-10-CM | POA: Insufficient documentation

## 2015-06-07 DIAGNOSIS — J45909 Unspecified asthma, uncomplicated: Secondary | ICD-10-CM | POA: Diagnosis not present

## 2015-06-07 DIAGNOSIS — R21 Rash and other nonspecific skin eruption: Secondary | ICD-10-CM | POA: Diagnosis present

## 2015-06-07 LAB — RPR: RPR: NONREACTIVE

## 2015-06-07 LAB — CBC WITH DIFFERENTIAL/PLATELET
BASOS ABS: 0 10*3/uL (ref 0.0–0.1)
BASOS PCT: 0 %
EOS ABS: 0 10*3/uL (ref 0.0–0.7)
Eosinophils Relative: 0 %
HCT: 32.3 % — ABNORMAL LOW (ref 36.0–46.0)
HEMOGLOBIN: 10.7 g/dL — AB (ref 12.0–15.0)
Lymphocytes Relative: 38 %
Lymphs Abs: 4.5 10*3/uL — ABNORMAL HIGH (ref 0.7–4.0)
MCH: 28.6 pg (ref 26.0–34.0)
MCHC: 33.1 g/dL (ref 30.0–36.0)
MCV: 86.4 fL (ref 78.0–100.0)
Monocytes Absolute: 0.9 10*3/uL (ref 0.1–1.0)
Monocytes Relative: 8 %
NEUTROS PCT: 54 %
Neutro Abs: 6.3 10*3/uL (ref 1.7–7.7)
Platelets: 427 10*3/uL — ABNORMAL HIGH (ref 150–400)
RBC: 3.74 MIL/uL — AB (ref 3.87–5.11)
RDW: 13.3 % (ref 11.5–15.5)
WBC: 11.7 10*3/uL — AB (ref 4.0–10.5)

## 2015-06-07 LAB — BASIC METABOLIC PANEL
ANION GAP: 8 (ref 5–15)
BUN: 13 mg/dL (ref 6–20)
CHLORIDE: 105 mmol/L (ref 101–111)
CO2: 27 mmol/L (ref 22–32)
CREATININE: 0.91 mg/dL (ref 0.44–1.00)
Calcium: 9 mg/dL (ref 8.9–10.3)
GFR calc non Af Amer: >60 mL/min (ref >60)
Glucose, Bld: 92 mg/dL (ref 65–99)
Potassium: 3.5 mmol/L (ref 3.5–5.1)
SODIUM: 140 mmol/L (ref 135–145)

## 2015-06-07 LAB — HIV ANTIBODY (ROUTINE TESTING W REFLEX): HIV SCREEN 4TH GENERATION: NONREACTIVE

## 2015-06-07 MED ORDER — FLUCONAZOLE 100 MG PO TABS
100.0000 mg | ORAL_TABLET | Freq: Once | ORAL | Status: AC
  Administered 2015-06-07: 100 mg via ORAL
  Filled 2015-06-07: qty 1

## 2015-06-07 MED ORDER — FLUCONAZOLE 200 MG PO TABS: 200.0000 mg | ORAL_TABLET | ORAL | Status: AC

## 2015-06-07 NOTE — ED Notes (Signed)
Patient is resting comfortably. 

## 2015-06-07 NOTE — ED Notes (Signed)
Pt presents for re-evaluation of a rash she had evaluated on 06/05/2015, states it has not gotten any better.

## 2015-06-07 NOTE — ED Notes (Signed)
Bed: [id number] Expected date:  Expected time:  Means of arrival:  Comments: [age] F allergic reaction

## 2015-06-07 NOTE — Discharge Instructions (Signed)
Rash A rash is a change in the color or texture of the skin. There are many different types of rashes. You may have other problems that accompany your rash. CAUSES   Infections.  Allergic reactions. This can include allergies to pets or foods.  Certain medicines.  Exposure to certain chemicals, soaps, or cosmetics.  Heat.  Exposure to poisonous plants.  Tumors, both cancerous and noncancerous. SYMPTOMS   Redness.  Scaly skin.  Itchy skin.  Dry or cracked skin.  Bumps.  Blisters.  Pain. DIAGNOSIS  Your caregiver may do a physical exam to determine what type of rash you have. A skin sample (biopsy) may be taken and examined under a microscope. TREATMENT  Treatment depends on the type of rash you have. Your caregiver may prescribe certain medicines. For serious conditions, you may need to see a skin doctor (dermatologist). HOME CARE INSTRUCTIONS   Avoid the substance that caused your rash.  Do not scratch your rash. This can cause infection.  You may take cool baths to help stop itching.  Only take over-the-counter or prescription medicines as directed by your caregiver.  Keep all follow-up appointments as directed by your caregiver. SEEK IMMEDIATE MEDICAL CARE IF:  You have increasing pain, swelling, or redness.  You have a fever.  You have new or severe symptoms.  You have body aches, diarrhea, or vomiting.  Your rash is not better after 3 days. MAKE SURE YOU:  Understand these instructions.  Will watch your condition.  Will get help right away if you are not doing well or get worse.   This information is not intended to replace advice given to you by your health care provider. Make sure you discuss any questions you have with your health care provider.   Document Released: 12/10/2001 Document Revised: 01/10/2014 Document Reviewed: 05/07/2014 Elsevier Interactive Patient Education 2016 Elsevier Inc.   Body Ringworm Ringworm (tinea corporis) is  a fungal infection of the skin on the body. This infection is not caused by worms, but is actually caused by a fungus. Fungus normally lives on the top of your skin and can be useful. However, in the case of ringworms, the fungus grows out of control and causes a skin infection. It can involve any area of skin on the body and can spread easily from one person to another (contagious). Ringworm is a common problem for children, but it can affect adults as well. Ringworm is also often found in athletes, especially wrestlers who share equipment and mats.  CAUSES  Ringworm of the body is caused by a fungus called dermatophyte. It can spread by:  Touchingother people who are infected.  Touchinginfected pets.  Touching or sharingobjects that have been in contact with the infected person or pet (hats, combs, towels, clothing, sports equipment). SYMPTOMS   Itchy, raised red spots and bumps on the skin.  Ring-shaped rash.  Redness near the border of the rash with a clear center.  Dry and scaly skin on or around the rash. Not every person develops a ring-shaped rash. Some develop only the red, scaly patches. DIAGNOSIS  Most often, ringworm can be diagnosed by performing a skin exam. Your caregiver may choose to take a skin scraping from the affected area. The sample will be examined under the microscope to see if the fungus is present.  TREATMENT  Body ringworm may be treated with a topical antifungal cream or ointment. Sometimes, an antifungal shampoo that can be used on your body is prescribed. You may  be prescribed antifungal medicines to take by mouth if your ringworm is severe, keeps coming back, or lasts a long time.  HOME CARE INSTRUCTIONS   Only take over-the-counter or prescription medicines as directed by your caregiver.  Wash the infected area and dry it completely before applying yourcream or ointment.  When using antifungal shampoo to treat the ringworm, leave the shampoo on the  body for 3-5 minutes before rinsing.   Wear loose clothing to stop clothes from rubbing and irritating the rash.  Wash or change your bed sheets every night while you have the rash.  Have your pet treated by your veterinarian if it has the same infection. To prevent ringworm:   Practice good hygiene.  Wear sandals or shoes in public places and showers.  Do not share personal items with others.  Avoid touching red patches of skin on other people.  Avoid touching pets that have bald spots or wash your hands after doing so. SEEK MEDICAL CARE IF:   Your rash continues to spread after 7 days of treatment.  Your rash is not gone in 4 weeks.  The area around your rash becomes red, warm, tender, and swollen.   This information is not intended to replace advice given to you by your health care provider. Make sure you discuss any questions you have with your health care provider.   Document Released: 12/18/1999 Document Revised: 09/14/2011 Document Reviewed: 07/04/2011 Elsevier Interactive Patient Education [employer] Elsevier Inc.

## 2015-06-07 NOTE — ED Provider Notes (Signed)
CSN: [id number]     Arrival date & time 06/07/15  0137 History   First MD Initiated Contact with Patient 06/07/15 0210     Chief Complaint  Patient presents with  . Rash     (Consider location/radiation/quality/duration/timing/severity/associated sxs/prior Treatment) HPI    Patient to the ER with PMH obesity, diabetes (no longer takes Metformin- states she eats healthy now) and asthma with complaint of itchy rash to arm, breast, abdomen for 1 or closer to two months. She says that the hives are worse on her abdomen. She was seen by myself a few days ago and rx prednisone and Keflex, she states that he symptoms have worsened.   She denies having fevers, N/V/D, no oozing, scabbing, flaking or scaling noted. She denies that anyone else has this rash. She has not had any vaginal bleeding or discharge and denies having any lesions on her genitalia buttocks. She has not had any headache, fevers, joint swelling, myalgias, arthralgias, tick bites that she's noticed. The rash is the worst on her stretch marks on her abdomen.   Past Medical History  Diagnosis Date  . Obesity    Past Surgical History  Procedure Laterality Date  . Flat feet repaired     Family History  Problem Relation Age of Onset  . Diabetes Father    Social History  Substance Use Topics  . Smoking status: Current Some Day Smoker    Types: Cigarettes  . Smokeless tobacco: Never Used  . Alcohol Use: No   OB History    Gravida Para Term Preterm AB TAB SAB Ectopic Multiple Living   1 0 0 0 1  1   0     Review of Systems  Review of Systems All other systems negative except as documented in the HPI. All pertinent positives and negatives as reviewed in the HPI.   Allergies  Review of patient's allergies indicates no known allergies.  Home Medications   Prior to Admission medications   Medication Sig Start Date End Date Taking? Authorizing Provider  cephALEXin (KEFLEX) 500 MG capsule Take 1 capsule (500 mg total)  by mouth 2 (two) times daily. 06/05/15  Yes Makayla Lanter Neva Seat, PA-C   BP 90/49 mmHg  Pulse 71  Temp(Src) 98 F (36.7 C) (Oral)  Resp 19  Ht  (1.676 m)  Wt 105.235 kg  BMI 37.46 kg/m2  SpO2 100%  LMP 05/04/2015 Physical Exam Constitutional: She appears well-developed and well-nourished. No distress.  HENT:  Head: Normocephalic and atraumatic.  Eyes: Pupils are equal, round, and reactive to light.  Neck: Normal range of motion. Neck supple.  Cardiovascular: Normal rate and regular rhythm.  Pulmonary/Chest: Effort normal.  Abdominal: Soft.  Neurological: She is alert.  Skin: Skin is warm and dry. Rash noted. No abrasion, no burn, no ecchymosis, no laceration and no purpura noted. Rash is urticarial. Rash is not macular, not papular, not maculopapular, not nodular, not pustular and not vesicular.  Urticarial like lesions to bilateral upper arms, chest and abdomen. Without signs of excoriation.  Nursing note and vitals reviewed.  ED Course  Procedures (including critical care time) Labs Review Labs Reviewed  CBC WITH DIFFERENTIAL/PLATELET - Abnormal; Notable for the following:    WBC 11.7 (*)    RBC 3.74 (*)    Hemoglobin 10.7 (*)    HCT 32.3 (*)    Platelets 427 (*)    Lymphs Abs 4.5 (*)    All other components within normal limits  BASIC METABOLIC PANEL  RPR  HIV ANTIBODY (ROUTINE TESTING)    Imaging Review No results found. I have personally reviewed and evaluated these images and lab results as part of my medical decision-making.   EKG Interpretation None      MDM   Final diagnoses:  Rash    . RPR and HIV pending, changed to diflucan PO. One given in ED and then advised to take 1 every three days dispense 4 tabs   Follow up with PCP in 2-3 days. Discussed return precautions  Filed Vitals:   06/07/15 0138 06/07/15 0533  BP: 136/88 90/49  Pulse: 84 71  Temp: 98 F (36.7 C)   Resp: 22 [id number]          Nitara Szczerba, PA-C 06/15/15 2326  Zadie Rhineonald  [place], MD 06/30/15 [phone]

## 2015-07-28 ENCOUNTER — Emergency Department (HOSPITAL_COMMUNITY)
Admission: EM | Admit: 2015-07-28 | Discharge: 2015-07-28 | Disposition: A | Discharge status: Home / Self Care | Payer: Medicare Other | Attending: Dermatology | Admitting: Dermatology

## 2015-07-28 ENCOUNTER — Encounter (HOSPITAL_COMMUNITY): Payer: Self-pay

## 2015-07-28 DIAGNOSIS — Z5321 Procedure and treatment not carried out due to patient leaving prior to being seen by health care provider: Secondary | ICD-10-CM | POA: Insufficient documentation

## 2015-07-28 DIAGNOSIS — R21 Rash and other nonspecific skin eruption: Secondary | ICD-10-CM | POA: Diagnosis present

## 2015-07-28 NOTE — ED Notes (Signed)
Pt and visitor left without being seen by a provider. I informed them that a provider had signed up and would be in in a timely manner. They stated that they couldn't wait. Pt stated that she would come back tomorrow.

## 2015-07-28 NOTE — ED Triage Notes (Signed)
Pt complains of hives on her back and arms for over two weeks, she was around a strange dog around that time

## 2015-07-28 NOTE — ED Notes (Signed)
Pt visitor came out looking for "Dr Pam Drown." I informed the patient that no physicians are here by that name. The patient was made aware that the provider would be in to see the pt as soon as they could.

## 2015-07-30 ENCOUNTER — Encounter (HOSPITAL_COMMUNITY): Payer: Self-pay | Admitting: Emergency Medicine

## 2015-07-30 ENCOUNTER — Ambulatory Visit (HOSPITAL_COMMUNITY)
Admission: EM | Admit: 2015-07-30 | Discharge: 2015-07-30 | Disposition: A | Discharge status: Home / Self Care | Payer: Medicare Other | Attending: Physician Assistant | Admitting: Physician Assistant

## 2015-07-30 DIAGNOSIS — L509 Urticaria, unspecified: Secondary | ICD-10-CM

## 2015-07-30 MED ORDER — METHYLPREDNISOLONE ACETATE 80 MG/ML IJ SUSP
80.0000 mg | Freq: Once | INTRAMUSCULAR | Status: AC
  Administered 2015-07-30: 80 mg via INTRAMUSCULAR

## 2015-07-30 MED ORDER — METHYLPREDNISOLONE ACETATE 80 MG/ML IJ SUSP
INTRAMUSCULAR | Status: AC
  Filled 2015-07-30: qty 1

## 2015-07-30 MED ORDER — PREDNISONE 10 MG PO TABS: 20.0000 mg | ORAL_TABLET | Freq: Every day | ORAL | 0 refills | Status: DC

## 2015-07-30 NOTE — ED Provider Notes (Signed)
CSN: [id number]     Arrival date & time 07/30/15  1153 History   None    Chief Complaint  Patient presents with  . Rash   (Consider location/radiation/quality/duration/timing/severity/associated sxs/prior Treatment) HPI Patient is a 30 year old female with a one-week history of urticaria. She states it is been itching. She denies any treatment at home. States that she does not know what she may be allergic to. Has recently changed soaps at home. No one else in the home has any symptoms. There is a family history of diabetes she has no pain at this time. Past Medical History:  Diagnosis Date  . Obesity    Past Surgical History:  Procedure Laterality Date  . Flat feet repaired     Family History  Problem Relation Age of Onset  . Diabetes Father    Social History  Substance Use Topics  . Smoking status: Current Some Day Smoker    Types: Cigarettes  . Smokeless tobacco: Never Used  . Alcohol use No   OB History    Gravida Para Term Preterm AB Living   1 0 0 0 1 0   SAB TAB Ectopic Multiple Live Births   1             Review of Systems  Denies: HEADACHE, NAUSEA, ABDOMINAL PAIN, CHEST PAIN, CONGESTION, DYSURIA, SHORTNESS OF BREATH  Allergies  Review of patient's allergies indicates no known allergies.  Home Medications   Prior to Admission medications   Medication Sig Start Date End Date Taking? Authorizing Provider  cephALEXin (KEFLEX) 500 MG capsule Take 1 capsule (500 mg total) by mouth 2 (two) times daily. Patient not taking: Reported on 07/28/2015 06/05/15   Marlon Pel, PA-C  predniSONE (DELTASONE) 10 MG tablet Take 2 tablets (20 mg total) by mouth daily. 07/30/15   Tharon Aquas, PA   Meds Ordered and Administered this Visit   Medications  methylPREDNISolone acetate (DEPO-MEDROL) injection 80 mg (80 mg Intramuscular Given 07/30/15 1240)    Pulse 86   Temp 98.1 F (36.7 C) (Oral)   Resp 16   LMP 07/29/2015 (Exact Date)   SpO2 99%  No data  found.   Physical Exam NURSES NOTES AND VITAL SIGNS REVIEWED. CONSTITUTIONAL: Well developed, well nourished, no acute distress HEENT: normocephalic, atraumatic EYES: Conjunctiva normal NECK:normal ROM, supple, no adenopathy PULMONARY:No respiratory distress, normal effort ABDOMINAL: Soft, ND, NT BS+, No CVAT MUSCULOSKELETAL: Normal ROM of all extremities,  SKIN: warm and dry both upper arms and both lower legs urticaria noted. There is also some on the chest. PSYCHIATRIC: Mood and affect, behavior are normal  Urgent Care Course   Clinical Course    Procedures (including critical care time)  Labs Review Labs Reviewed - No data to display  Imaging Review No results found.   Visual Acuity Review  Right Eye Distance:   Left Eye Distance:   Bilateral Distance:    Right Eye Near:   Left Eye Near:    Bilateral Near:       Prescription for prednisone and Benadryl as provided to the patient.  MDM   1. Urticaria     Patient is reassured that there are no issues that require transfer to higher level of care at this time or additional tests. Patient is advised to continue home symptomatic treatment. Patient is advised that if there are new or worsening symptoms to attend the emergency department, contact primary care provider, or return to UC. Instructions of care provided discharged home in  stable condition.    THIS NOTE WAS GENERATED USING A VOICE RECOGNITION SOFTWARE PROGRAM. ALL REASONABLE EFFORTS  WERE MADE TO PROOFREAD THIS DOCUMENT FOR ACCURACY.  I have verbally reviewed the discharge instructions with the patient. A printed AVS was given to the patient.  All questions were answered prior to discharge.      Tharon Aquas, PA 07/30/15 [id number]

## 2015-07-30 NOTE — ED Triage Notes (Signed)
The patient presented to the [facility] with a complaint of a rash on her arms and legs and torso x 2 months.

## 2015-07-31 ENCOUNTER — Ambulatory Visit: Payer: Medicare Other | Admitting: Family Medicine

## 2015-08-10 ENCOUNTER — Ambulatory Visit: Payer: Medicare Other | Admitting: Family Medicine

## 2015-08-14 ENCOUNTER — Telehealth: Payer: Self-pay | Admitting: Family Medicine

## 2015-08-14 NOTE — Telephone Encounter (Signed)
Pt called because the medication that she was given to try and get pregnant is not working and she wanted to know what other medication can be prescribed. jw

## 2015-08-14 NOTE — Telephone Encounter (Signed)
Patient has been taking metformin >6 months and believes it is not working as she still has'nt concieved. Will forward to PCP.

## 2015-09-04 ENCOUNTER — Encounter: Payer: Medicare Other | Admitting: Family Medicine

## 2015-09-30 ENCOUNTER — Ambulatory Visit: Payer: Medicare Other | Admitting: Podiatry

## 2015-10-05 ENCOUNTER — Ambulatory Visit: Payer: Medicare Other | Admitting: Podiatry

## 2015-10-05 ENCOUNTER — Ambulatory Visit: Payer: Medicare Other | Admitting: Internal Medicine

## 2015-11-02 ENCOUNTER — Ambulatory Visit (INDEPENDENT_AMBULATORY_CARE_PROVIDER_SITE_OTHER): Payer: Medicare Other | Admitting: Student

## 2015-11-02 ENCOUNTER — Encounter: Payer: Self-pay | Admitting: Student

## 2015-11-02 VITALS — BP 130/67 | HR 75 | Temp 97.7°F | Ht 66.0 in | Wt 217.4 lb

## 2015-11-02 DIAGNOSIS — Z3200 Encounter for pregnancy test, result unknown: Secondary | ICD-10-CM

## 2015-11-02 DIAGNOSIS — Z23 Encounter for immunization: Secondary | ICD-10-CM | POA: Diagnosis not present

## 2015-11-02 DIAGNOSIS — O0931 Supervision of pregnancy with insufficient antenatal care, first trimester: Secondary | ICD-10-CM

## 2015-11-02 DIAGNOSIS — Z113 Encounter for screening for infections with a predominantly sexual mode of transmission: Secondary | ICD-10-CM

## 2015-11-02 DIAGNOSIS — O219 Vomiting of pregnancy, unspecified: Secondary | ICD-10-CM

## 2015-11-02 DIAGNOSIS — Z114 Encounter for screening for human immunodeficiency virus [HIV]: Secondary | ICD-10-CM

## 2015-11-02 DIAGNOSIS — Z3201 Encounter for pregnancy test, result positive: Secondary | ICD-10-CM | POA: Diagnosis not present

## 2015-11-02 DIAGNOSIS — Z3490 Encounter for supervision of normal pregnancy, unspecified, unspecified trimester: Secondary | ICD-10-CM

## 2015-11-02 DIAGNOSIS — Z3491 Encounter for supervision of normal pregnancy, unspecified, first trimester: Secondary | ICD-10-CM

## 2015-11-02 LAB — POCT URINE PREGNANCY: PREG TEST UR: POSITIVE — AB

## 2015-11-02 MED ORDER — PRENATAL VITAMIN 27-0.8 MG PO TABS: 1.0000 | ORAL_TABLET | Freq: Every day | ORAL | 3 refills | Status: AC

## 2015-11-02 MED ORDER — VITAMIN B6 250 MG PO TABS: 250.0000 mg | ORAL_TABLET | Freq: Two times a day (BID) | ORAL | 0 refills | Status: AC

## 2015-11-02 NOTE — Progress Notes (Signed)
   Subjective:    Patient ID: Kelli Hurst is a 30 y.o. old female. Patient is here accompanied by boyfriend to discuss about positive pregnancy test at home and nausea.  HPI   #Positive pregnancy test at home: this is planned pregnancy. LMP 08/31/2015. She is G2P0010. She had one miscarriage at about two months of gestation due to "stress". Last birth control use was in 07/2011. She was on combined oral contraceptive at that time.  Patient reports nausea. She reports 3 episodes of emesis about 4 days ago back-to-back. No further emesis after that.  She does not take multivitamin or folic acid. She denies urinary symptoms such as dysuria, frequent urination, urge, fever, chills, abdominal pain or new back pain.   PMH: PCOS. Was on metformin until about a year ago. She is not on any medication at this time.   SH: quit smoking and drinking. Last cigarette and drink was on 10/06/2015. [employer] home coming. She denies recreational drug use.  Review of Systems Per HPI Objective:   Vitals:   11/02/15 1206  BP: 130/67  Pulse: 75  Temp: 97.7 F (36.5 C)  TempSrc: Oral  SpO2: 99%  Weight: 217 lb 6.4 oz (98.6 kg)  Height: 5\' 6"  (1.676 m)    GEN: appears well, no apparent distress. CVS: RRR, normal s1 and s2, no murmurs, no edema, cap refill <2 sec RESP: no increased work of breathing, good air movement bilaterally, no crackles or wheeze GI: Bowel sounds present and normal, soft, non-tender,non-distended GU: no suprapubic tenderness or palpable mass SKIN: no apparent scaly lesion NEURO: alert and oriented appropriately, no gross defecits  PSYCH: appropriate mood and affect     Assessment & Plan:  Positive pregnancy test Planned pregnancy at 3237w0d of gestation by LMP. Patient is G2 P0010. Pregnancy complicated by history of PCOS and social situation. Patient was smoking and drinking during the first 5 weeks of pregnancy. They have no means of transportation. She was given bus pass today.    -Ordered OB panel, sickle cell screening, OB ultrasound and OB urine culture. Unfortunately patient was not able to give us a urine today.  -gave prescription for prenatal vitamins and Pyridoxine today -Received flu vaccine today -gave handout about the first trimester pregnancy -follow-up in a week for her first prenatal visit -Recommend getting an OB urine culture, Pap smear, STD screening, discussing genetic screening and further assessment of social situation when she returns for a first prenatal visit.

## 2015-11-02 NOTE — Progress Notes (Signed)
Pt is here with FOB Lottie Dawson(Quirston Snipes, not a pt of [facility]) for pregnancy test.  They requested 2 bus passes to get home today.  Educated patient on limited amount of bus passes and inability to provide these for them at every visit.  They expressed understanding. Lexxus Underhill, Maryjo RochesterJessica Dawn, CMA

## 2015-11-02 NOTE — Patient Instructions (Signed)
Congratulations on your pregnancy! I have ordered some tests today. I have also ordered an ultrasound. Someone will discuss the results with you when you come back for your first pregnancy visit. Your first visit will be at least an hour long.   I have also sent prescriptions for prenatal vitamins and vitamin B6 to your pharmacy. Please pickup this prescription and start taking  Please  schedule your first prenatal visit as soon as possible.    First Trimester of Pregnancy The first trimester of pregnancy is from week 1 until the end of week 12 (months 1 through 3). A week after a sperm fertilizes an egg, the egg will implant on the wall of the uterus. This embryo will begin to develop into a baby. Genes from you and your partner are forming the baby. The female genes determine whether the baby is a boy or a girl. At 6-8 weeks, the eyes and face are formed, and the heartbeat can be seen on ultrasound. At the end of 12 weeks, all the baby's organs are formed.  Now that you are pregnant, you will want to do everything you can to have a healthy baby. Two of the most important things are to get good prenatal care and to follow your health care provider's instructions. Prenatal care is all the medical care you receive before the baby's birth. This care will help prevent, find, and treat any problems during the pregnancy and childbirth. BODY CHANGES Your body goes through many changes during pregnancy. The changes vary from woman to woman.   You may gain or lose a couple of pounds at first.  You may feel sick to your stomach (nauseous) and throw up (vomit). If the vomiting is uncontrollable, call your health care provider.  You may tire easily.  You may develop headaches that can be relieved by medicines approved by your health care provider.  You may urinate more often. Painful urination may mean you have a bladder infection.  You may develop heartburn as a result of your pregnancy.  You may  develop constipation because certain hormones are causing the muscles that push waste through your intestines to slow down.  You may develop hemorrhoids or swollen, bulging veins (varicose veins).  Your breasts may begin to grow larger and become tender. Your nipples may stick out more, and the tissue that surrounds them (areola) may become darker.  Your gums may bleed and may be sensitive to brushing and flossing.  Dark spots or blotches (chloasma, mask of pregnancy) may develop on your face. This will likely fade after the baby is born.  Your menstrual periods will stop.  You may have a loss of appetite.  You may develop cravings for certain kinds of food.  You may have changes in your emotions from day to day, such as being excited to be pregnant or being concerned that something may go wrong with the pregnancy and baby.  You may have more vivid and strange dreams.  You may have changes in your hair. These can include thickening of your hair, rapid growth, and changes in texture. Some women also have hair loss during or after pregnancy, or hair that feels dry or thin. Your hair will most likely return to normal after your baby is born. WHAT TO EXPECT AT YOUR PRENATAL VISITS During a routine prenatal visit:  You will be weighed to make sure you and the baby are growing normally.  Your blood pressure will be taken.  Your abdomen will  be measured to track your baby's growth.  The fetal heartbeat will be listened to starting around week 10 or 12 of your pregnancy.  Test results from any previous visits will be discussed. Your health care provider may ask you:  How you are feeling.  If you are feeling the baby move.  If you have had any abnormal symptoms, such as leaking fluid, bleeding, severe headaches, or abdominal cramping.  If you are using any tobacco products, including cigarettes, chewing tobacco, and electronic cigarettes.  If you have any questions. Other tests  that may be performed during your first trimester include:  Blood tests to find your blood type and to check for the presence of any previous infections. They will also be used to check for low iron levels (anemia) and Rh antibodies. Later in the pregnancy, blood tests for diabetes will be done along with other tests if problems develop.  Urine tests to check for infections, diabetes, or protein in the urine.  An ultrasound to confirm the proper growth and development of the baby.  An amniocentesis to check for possible genetic problems.  Fetal screens for spina bifida and Down syndrome.  You may need other tests to make sure you and the baby are doing well.  HIV (human immunodeficiency virus) testing. Routine prenatal testing includes screening for HIV, unless you choose not to have this test. HOME CARE INSTRUCTIONS  Medicines  Follow your health care provider's instructions regarding medicine use. Specific medicines may be either safe or unsafe to take during pregnancy.  Take your prenatal vitamins as directed.  If you develop constipation, try taking a stool softener if your health care provider approves. Diet  Eat regular, well-balanced meals. Choose a variety of foods, such as meat or vegetable-based protein, fish, milk and low-fat dairy products, vegetables, fruits, and whole grain breads and cereals. Your health care provider will help you determine the amount of weight gain that is right for you.  Avoid raw meat and uncooked cheese. These carry germs that can cause birth defects in the baby.  Eating four or five small meals rather than three large meals a day may help relieve nausea and vomiting. If you start to feel nauseous, eating a few soda crackers can be helpful. Drinking liquids between meals instead of during meals also seems to help nausea and vomiting.  If you develop constipation, eat more high-fiber foods, such as fresh vegetables or fruit and whole grains. Drink  enough fluids to keep your urine clear or pale yellow. Activity and Exercise  Exercise only as directed by your health care provider. Exercising will help you:  Control your weight.  Stay in shape.  Be prepared for labor and delivery.  Experiencing pain or cramping in the lower abdomen or low back is a good sign that you should stop exercising. Check with your health care provider before continuing normal exercises.  Try to avoid standing for long periods of time. Move your legs often if you must stand in one place for a long time.  Avoid heavy lifting.  Wear low-heeled shoes, and practice good posture.  You may continue to have sex unless your health care provider directs you otherwise. Relief of Pain or Discomfort  Wear a good support bra for breast tenderness.   Take warm sitz baths to soothe any pain or discomfort caused by hemorrhoids. Use hemorrhoid cream if your health care provider approves.   Rest with your legs elevated if you have leg cramps or low  back pain.  If you develop varicose veins in your legs, wear support hose. Elevate your feet for 15 minutes, 3-4 times a day. Limit salt in your diet. Prenatal Care  Schedule your prenatal visits by the twelfth week of pregnancy. They are usually scheduled monthly at first, then more often in the last 2 months before delivery.  Write down your questions. Take them to your prenatal visits.  Keep all your prenatal visits as directed by your health care provider. Safety  Wear your seat belt at all times when driving.  Make a list of emergency phone numbers, including numbers for family, friends, the hospital, and police and fire departments. General Tips  Ask your health care provider for a referral to a local prenatal education class. Begin classes no later than at the beginning of month 6 of your pregnancy.  Ask for help if you have counseling or nutritional needs during pregnancy. Your health care provider can  offer advice or refer you to specialists for help with various needs.  Do not use hot tubs, steam rooms, or saunas.  Do not douche or use tampons or scented sanitary pads.  Do not cross your legs for long periods of time.  Avoid cat litter boxes and soil used by cats. These carry germs that can cause birth defects in the baby and possibly loss of the fetus by miscarriage or stillbirth.  Avoid all smoking, herbs, alcohol, and medicines not prescribed by your health care provider. Chemicals in these affect the formation and growth of the baby.  Do not use any tobacco products, including cigarettes, chewing tobacco, and electronic cigarettes. If you need help quitting, ask your health care provider. You may receive counseling support and other resources to help you quit.  Schedule a dentist appointment. At home, brush your teeth with a soft toothbrush and be gentle when you floss. SEEK MEDICAL CARE IF:   You have dizziness.  You have mild pelvic cramps, pelvic pressure, or nagging pain in the abdominal area.  You have persistent nausea, vomiting, or diarrhea.  You have a bad smelling vaginal discharge.  You have pain with urination.  You notice increased swelling in your face, hands, legs, or ankles. SEEK IMMEDIATE MEDICAL CARE IF:   You have a fever.  You are leaking fluid from your vagina.  You have spotting or bleeding from your vagina.  You have severe abdominal cramping or pain.  You have rapid weight gain or loss.  You vomit blood or material that looks like coffee grounds.  You are exposed to [place] measles and have never had them.  You are exposed to fifth disease or chickenpox.  You develop a severe headache.  You have shortness of breath.  You have any kind of trauma, such as from a fall or a car accident.   This information is not intended to replace advice given to you by your health care provider. Make sure you discuss any questions you have with your  health care provider.   Document Released: 12/14/2000 Document Revised: 01/10/2014 Document Reviewed: 10/30/2012 Elsevier Interactive Patient Education [employer]2016 [employer] Inc.

## 2015-11-03 DIAGNOSIS — Z3201 Encounter for pregnancy test, result positive: Secondary | ICD-10-CM | POA: Insufficient documentation

## 2015-11-03 LAB — OBSTETRIC PANEL
ANTIBODY SCREEN: NEGATIVE
BASOS PCT: 0 %
Basophils Absolute: 0 cells/uL (ref 0–200)
EOS PCT: 0 %
Eosinophils Absolute: 0 cells/uL — ABNORMAL LOW (ref 15–500)
HEMATOCRIT: 34.7 % — AB (ref 35.0–45.0)
HEMOGLOBIN: 11.3 g/dL — AB (ref 11.7–15.5)
Hepatitis B Surface Ag: NEGATIVE
LYMPHS PCT: 28 %
Lymphs Abs: 2436 cells/uL (ref 850–3900)
MCH: 28.1 pg (ref 27.0–33.0)
MCHC: 32.6 g/dL (ref 32.0–36.0)
MCV: 86.3 fL (ref 80.0–100.0)
MPV: 10 fL (ref 7.5–12.5)
Monocytes Absolute: 609 cells/uL (ref 200–950)
Monocytes Relative: 7 %
NEUTROS PCT: 65 %
Neutro Abs: 5655 cells/uL (ref 1500–7800)
PLATELETS: 420 10*3/uL — AB (ref 140–400)
RBC: 4.02 MIL/uL (ref 3.80–5.10)
RDW: 14.7 % (ref 11.0–15.0)
RH TYPE: POSITIVE
Rubella: 5.65 Index — ABNORMAL HIGH (ref <0.90)
WBC: 8.7 10*3/uL (ref 3.8–10.8)

## 2015-11-03 NOTE — Assessment & Plan Note (Addendum)
Planned pregnancy at 2351w0d of gestation by LMP. Patient is G2 P0010. Pregnancy complicated by history of PCOS and social situation. Patient was smoking and drinking during the first 5 weeks of pregnancy. They have no means of transportation. She was given bus pass today.  -Ordered OB panel, sickle cell screening, OB ultrasound and OB urine culture. Unfortunately patient was not able to give us a urine today.  -gave prescription for prenatal vitamins and Pyridoxine today -Received flu vaccine today -gave handout about the first trimester pregnancy -follow-up in a week for her first prenatal visit -Recommend getting an OB urine culture, Pap smear, STD screening, discussing genetic screening and further assessment of social situation when she returns for a first prenatal visit.

## 2015-11-04 LAB — SICKLE CELL SCREEN: SICKLE CELL SCREEN: NEGATIVE

## 2015-11-05 ENCOUNTER — Ambulatory Visit: Payer: Medicare Other | Admitting: Family Medicine

## 2015-11-09 ENCOUNTER — Ambulatory Visit (HOSPITAL_COMMUNITY)
Admission: RE | Admit: 2015-11-09 | Discharge: 2015-11-09 | Disposition: A | Discharge status: Home / Self Care | Payer: Medicare Other | Source: Ambulatory Visit | Attending: Student | Admitting: Student

## 2015-11-09 ENCOUNTER — Encounter (HOSPITAL_COMMUNITY): Payer: Self-pay | Admitting: Radiology

## 2015-11-09 DIAGNOSIS — Z3A11 11 weeks gestation of pregnancy: Secondary | ICD-10-CM | POA: Diagnosis not present

## 2015-11-09 DIAGNOSIS — Z3689 Encounter for other specified antenatal screening: Secondary | ICD-10-CM | POA: Insufficient documentation

## 2015-11-09 DIAGNOSIS — Z3201 Encounter for pregnancy test, result positive: Secondary | ICD-10-CM

## 2015-11-11 ENCOUNTER — Encounter: Payer: Medicare Other | Admitting: Internal Medicine

## 2015-11-11 ENCOUNTER — Other Ambulatory Visit (HOSPITAL_COMMUNITY)
Admission: RE | Admit: 2015-11-11 | Discharge: 2015-11-11 | Disposition: A | Discharge status: Home / Self Care | Payer: Medicare Other | Source: Ambulatory Visit | Attending: Family Medicine | Admitting: Family Medicine

## 2015-11-11 ENCOUNTER — Encounter: Payer: Self-pay | Admitting: Internal Medicine

## 2015-11-11 ENCOUNTER — Ambulatory Visit (INDEPENDENT_AMBULATORY_CARE_PROVIDER_SITE_OTHER): Payer: Medicare Other | Admitting: Internal Medicine

## 2015-11-11 VITALS — BP 139/81 | HR 79 | Wt 219.0 lb

## 2015-11-11 DIAGNOSIS — Z1151 Encounter for screening for human papillomavirus (HPV): Secondary | ICD-10-CM | POA: Diagnosis present

## 2015-11-11 DIAGNOSIS — Z113 Encounter for screening for infections with a predominantly sexual mode of transmission: Secondary | ICD-10-CM | POA: Insufficient documentation

## 2015-11-11 DIAGNOSIS — Z01419 Encounter for gynecological examination (general) (routine) without abnormal findings: Secondary | ICD-10-CM | POA: Insufficient documentation

## 2015-11-11 DIAGNOSIS — Z3491 Encounter for supervision of normal pregnancy, unspecified, first trimester: Secondary | ICD-10-CM | POA: Diagnosis present

## 2015-11-11 NOTE — Patient Instructions (Addendum)
It was nice meeting you today [name]!  Below you will find information on what to expect during the beginning of your pregnancy.   If you have any vaginal bleeding, discharge, contractions, or feel a gush of fluid (as if your water broke), please [facility] the [facility] [address] right away.   We will see you back in one month.   If you have any questions or concerns, please feel free to call the clinic.   Be well,  Dr. Natale MilchLancaster

## 2015-11-11 NOTE — Assessment & Plan Note (Addendum)
Early <14 wk ultrasound with no abnormalities. BP elevated initially (139/81), and patient left before BP able to be repeated. No abnormalities noted on physical exam today. OB labs collected at last visit with no abnormalities. Pap and GC/chlamydia collected today. [facility] form completed today Premature labor precautions given. Patient to return in four weeks for routine f/u.  - Obtain HIV and RPR at next visit - Early 1 hr glucola at next visit due to patient's weight and race - Monitor BP closely as elevated at this visit  - Genetic screening not discussed during appointment. Called patient twice after appointment to discuss, however patient did not answer. As screening ideally performed between 11-13wks and patient already at 68106w3d, will continue to call again tomorrow to discuss.

## 2015-11-11 NOTE — Progress Notes (Signed)
New OB Note  11/11/2015   Clinic: [facility]  Chief Complaint: New OB  Transfer of Care Patient: no  History of Present Illness: [name] is a 30 y.o. G2P0010 @ 2676w2d weeks (based on early US Patient's last menstrual period was 08/31/2015 (approximate).), with the above CC. Preg complicated by hx PCOS not currently medically treated, EtOH and tobacco use during first 5 weeks.   Her periods were: regular every month. She was using no method when she conceived.  She has Negative signs or symptoms of nausea/vomiting of pregnancy. She has Negative signs or symptoms of miscarriage or preterm labor She identifies Negative Zika risk factors for her and her partner  ROS: A 12-point review of systems was performed and negative, except as stated in the above HPI.  OBGYN History: As per HPI. OB History  Gravida Para Term Preterm AB Living  2 0 0 0 1 0  SAB TAB Ectopic Multiple Live Births  1            # Outcome Date GA Lbr Len/2nd Weight Sex Delivery Anes PTL Lv  2 Current           1 SAB 2014              Any prior children are healthy, doing well, without any problems or issues: not applicable History of pap smears: Yes. Last pap smear last year. Abnormal: no  History of STIs: Yes - gonorrhea and chlamydia each one time in the past. Treated with antibiotics both time. Reports chlamydia most recently last year.    Past Medical History: Past Medical History:  Diagnosis Date  . Obesity     Past Surgical History: Past Surgical History:  Procedure Laterality Date  . Flat feet repaired      Family History:  Family History  Problem Relation Age of Onset  . Diabetes Father    She denies any female cancers, bleeding or blood clotting disorders.  She denies any history of mental retardation, birth defects or genetic disorders in her or the FOB's history  Social History:  Social History   Social History  . Marital status: Single    Spouse name: N/A  . Number of children: N/A    . Years of education: N/A   Occupational History  . Not on file.   Social History Main Topics  . Smoking status: Former Smoker    Types: Cigarettes    Quit date: 10/19/2015  . Smokeless tobacco: Never Used  . Alcohol use No  . Drug use: No  . Sexual activity: Yes    Birth control/ protection: None   Other Topics Concern  . Not on file   Social History Narrative  . No narrative on file   Any pets in the household: no  Allergy: No Known Allergies  Health Maintenance:  Mammogram Up to Date: not applicable  Current Outpatient Medications: PNV Pyridoxine   Physical Exam:   BP 139/81   Pulse 79   Wt 219 lb (99.3 kg)   LMP 08/31/2015 (Approximate)   BMI 35.35 kg/m  Body mass index is 35.35 kg/m. Fundal height: unable to palpate given early gestational age and patient's body habitus FHTs: unable to assess on Doppler, however cardiac activity noted on portable ultrasound  General appearance: Overweight female in no acute distress.  Neck:  Supple, normal appearance, and no thyromegaly  Cardiovascular: S1, S2 normal, no murmur, rub or gallop, regular rate and rhythm Respiratory:  Clear to auscultation  bilateral. Normal respiratory effort Abdomen: positive bowel sounds and no masses, hernias; diffusely non tender to palpation, non distended Neuro/Psych:  Normal mood and affect.  Skin:  Warm and dry.   Pelvic exam: is not limited by body habitus EGBUS: within normal limits, Vagina: within normal limits and with no blood in the vault, Cervix: normal appearing cervix with moderate white mucinous discharge,  and Adnexa:  no mass, fullness, tenderness   Imaging:  [place] Ob Comp Less 14 Wks  Result Date: 11/10/2015 CLINICAL DATA:  Uncertain LMP. EXAM: OBSTETRIC <14 WK US AND TRANSVAGINAL OB US TECHNIQUE: Both transabdominal and transvaginal ultrasound examinations were performed for complete evaluation of the gestation as well as the maternal uterus, adnexal regions, and  pelvic cul-de-sac. Transvaginal technique was performed to assess early pregnancy. COMPARISON:  No prior. FINDINGS: Intrauterine gestational sac: Single Yolk sac:  Not visualized Embryo:  Visualized Cardiac Activity: Visualized Heart Rate: 164  bpm CRL:  4.23 cm 11 w   1 d                  US EDC: 05/29/2016 Subchorionic hemorrhage:  None visualized. Maternal uterus/adnexae: Unremarkable. IMPRESSION: Single viable injury pregnancy at 11 weeks 1 day. Electronically Signed   By: Maisie Fushomas  Register   On: 11/10/2015 07:58    Assessment: 30 yo G2P0010 with IUP confirmed by early US at 9469w3d presenting for initial OB visit.   Plan: Supervision of low-risk pregnancy, first trimester Early <14 wk ultrasound with no abnormalities. BP elevated initially (139/81), and patient left before BP able to be repeated. No abnormalities noted on physical exam today. OB labs collected at last visit with no abnormalities. Pap and GC/chlamydia collected today. [facility] form completed today Premature labor precautions given. Patient to return in four weeks for routine f/u.  - Obtain HIV and RPR at next visit - Early 1 hr glucola at next visit due to patient's weight and race - Monitor BP closely as elevated at this visit  - Genetic screening not discussed during appointment. Called patient twice after appointment to discuss, however patient did not answer. As screening ideally performed between 11-13wks and patient already at 3769w3d, will continue to call again tomorrow to discuss.    Tarri AbernethyAbigail J Lancaster, MD, MPH PGY-2 [facility] Pager [phone]

## 2015-11-12 LAB — CERVICOVAGINAL ANCILLARY ONLY
Chlamydia: NEGATIVE
NEISSERIA GONORRHEA: NEGATIVE

## 2015-11-12 NOTE — Progress Notes (Signed)
Pt is here with FOB Kelli Hurst(Kelli Hurst, not a pt of [facility]) for NOB visit.  They requested 2 bus passes to get home today.  Reminded pts that we gave them 2 passes last week as well. Again, educated patient on limited amount of bus passes and inability to provide these for them at every visit.  They expressed understanding. Kelli Hurst, Kelli Hurst, CMA

## 2015-11-13 LAB — CYTOLOGY - PAP
ADEQUACY: ABSENT
Diagnosis: NEGATIVE
HPV (WINDOPATH): NOT DETECTED

## 2015-12-08 ENCOUNTER — Ambulatory Visit (INDEPENDENT_AMBULATORY_CARE_PROVIDER_SITE_OTHER): Payer: Medicare Other | Admitting: Internal Medicine

## 2015-12-08 VITALS — BP 120/82 | HR 79 | Wt 225.0 lb

## 2015-12-08 DIAGNOSIS — Z3491 Encounter for supervision of normal pregnancy, unspecified, first trimester: Secondary | ICD-10-CM

## 2015-12-08 LAB — POCT 1 HR PRENATAL GLUCOSE: Glucose 1 Hr Prenatal, POC: 100 mg/dL

## 2015-12-08 NOTE — Assessment & Plan Note (Signed)
Doing well. Patient moving to [place] this week, so this will be her last OB visit at [facility].  - Instructed patient to find prenatal care provider in [place] and call to schedule an appointment tomorrow. Informed patient that she should tell the new office she needs to be seen in one month and will also need an anatomy ultrasound at that time.  - Early 1 hr glucola performed today - HIV drawn today

## 2015-12-08 NOTE — Progress Notes (Signed)
Patient ID: Kelli Hurst, female   DOB: Sep 13, 1985, 30 y.o.   MRN: [id number]  Kelli Hurst is a 30 y.o. G2P0010 at 533w2d here for routine follow up.  She reports no complaints. See flow sheet for details.  Of note, patient is moving to [place] to live with her mother in three days. She has not yet looked into a prenatal care provider in that area.   A/P: Pregnancy at 513w2d.  Doing well.   Pregnancy issues include - none. Patient is not interested in genetic testing.  Bleeding and pain precautions reviewed. Early 1 hour glucola performed today.  Fetal heart tones noted on doppler ultrasound. Uterus difficult to palpate due to patient's body habitus and early gestational age.  HIV performed today.   Follow up 4 weeks. Instructed patient to find a prenatal care provider in [place] and call to schedule an appointment as soon as possible. Informed patient that she should tell the new office she needs to be seen in one month and will also need an anatomy ultrasound at that time. Patient voiced understanding.   Tarri AbernethyAbigail J Stuart Mirabile, MD, MPH PGY-2 [facility] Pager [phone]

## 2015-12-08 NOTE — Patient Instructions (Addendum)
It was nice seeing you again today Kelli GravesBonita!  Please find a family medicine doctor or an OB Gyn in [place] and call right away (preferably tomorrow) to schedule an appointment for one month from now. If you decide to see a family medicine doctor, make sure they see prenatal/OB patients. You will need to be seen for a regular prenatal appointment, and you will also need an anatomy ultrasound.   Please go to [facility] if you notice any of the following: - You feel a gush of fluid (as if your water broke) - You begin having contractions - You are not feeling your baby move - You have vaginal bleeding  The address for [facility] is: [address]  [place], [place] [address] [phone]   If you have any questions or concerns in the meantime, please feel free to call the clinic.   Be well,  Dr. Natale MilchLancaster

## 2015-12-09 LAB — HIV ANTIBODY (ROUTINE TESTING W REFLEX): HIV 1&2 Ab, 4th Generation: NONREACTIVE

## 2015-12-15 ENCOUNTER — Telehealth: Payer: Self-pay | Admitting: Family Medicine

## 2015-12-15 DIAGNOSIS — Z3480 Encounter for supervision of other normal pregnancy, unspecified trimester: Secondary | ICD-10-CM

## 2015-12-15 NOTE — Telephone Encounter (Signed)
Pt has moved to [place], pt is also pregnant. Pt needs a referral to see Dr. Thomasene LotErnest Graham. Mom is in process of trying to change Medicaid over to a local doctor. Please advise. Thanks! ep

## 2015-12-21 NOTE — Telephone Encounter (Signed)
Patient calls again, needs referral sent ASAP. Needs to see OBGYN before Christmas. Please place referral.

## 2015-12-21 NOTE — Telephone Encounter (Signed)
referral placed please notify pt

## 2016-09-15 ENCOUNTER — Encounter (HOSPITAL_COMMUNITY): Payer: Self-pay

## 2017-07-20 IMAGING — US US OB COMP LESS 14 WK
1 series · 15 of 18 positions shown · non-contrast
Comparison: No prior.

CLINICAL DATA: Uncertain LMP.

EXAM:
OBSTETRIC <14 WK US AND TRANSVAGINAL OB US
TECHNIQUE: Both transabdominal and transvaginal ultrasound examinations were
performed for complete evaluation of the gestation as well as the
maternal uterus, adnexal regions, and pelvic cul-de-sac.
Transvaginal technique was performed to assess early pregnancy.

[Series 1: us ob comp less 14 wk · 18 acquisitions, 15 frames shown]
[im 1/18]
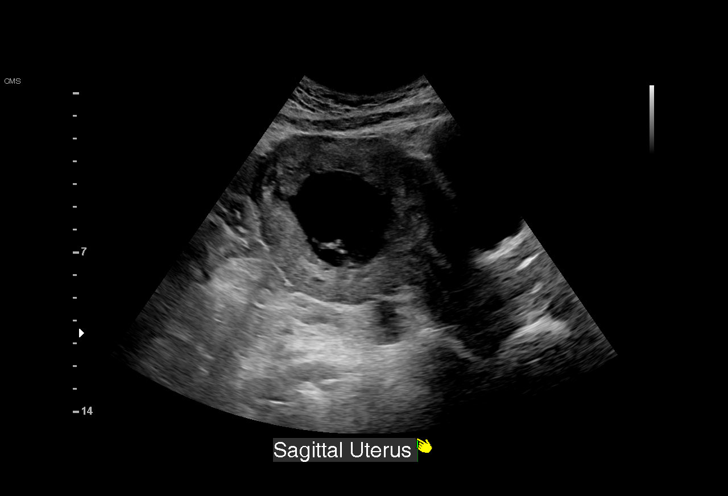
[im 2/18]
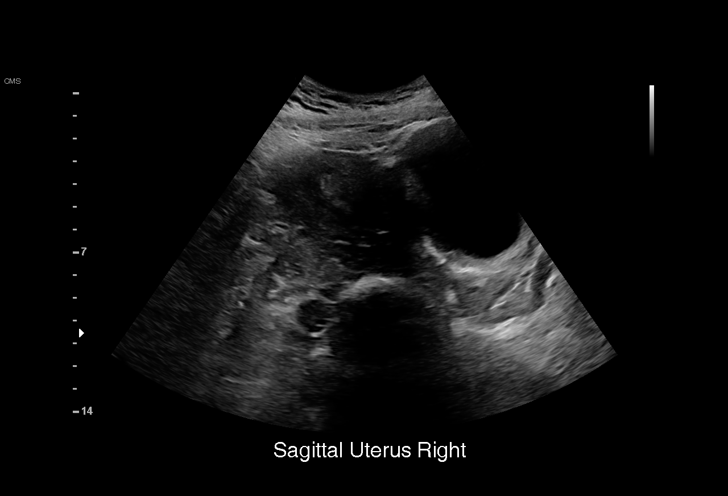
[im 4/18]
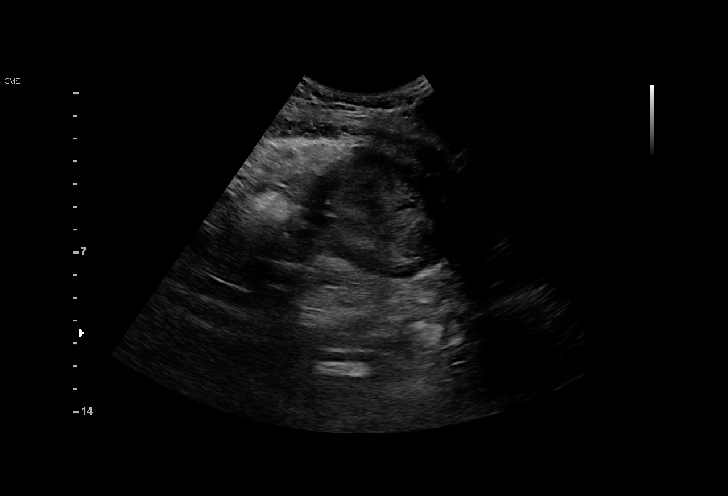
[im 5/18]
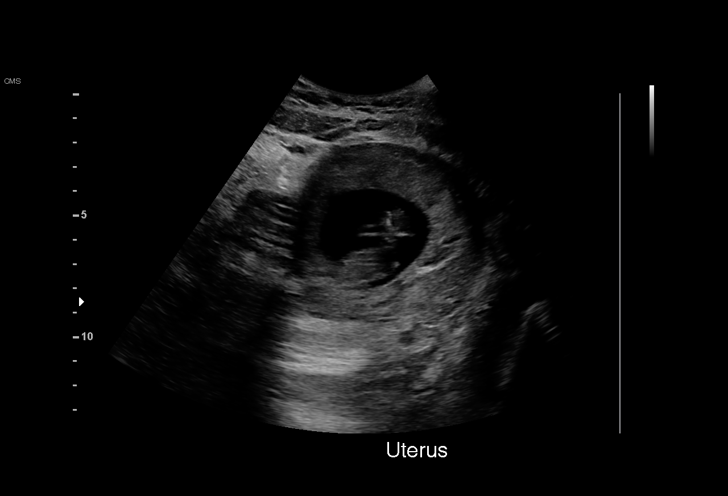
[im 6/18]
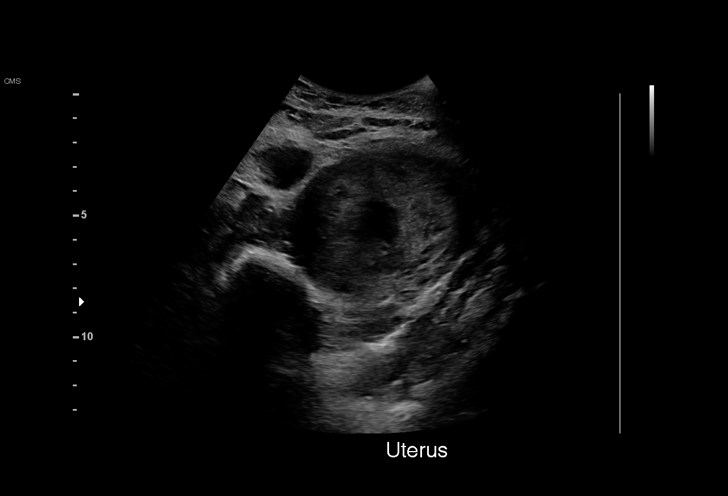
[im 7/18]
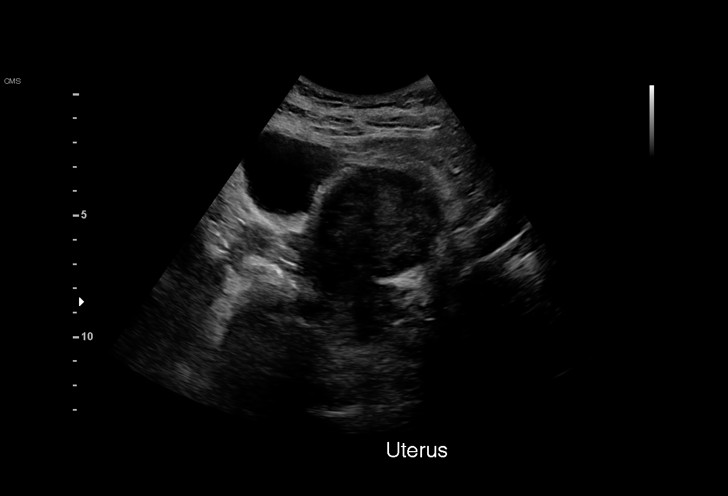
[im 8/18]
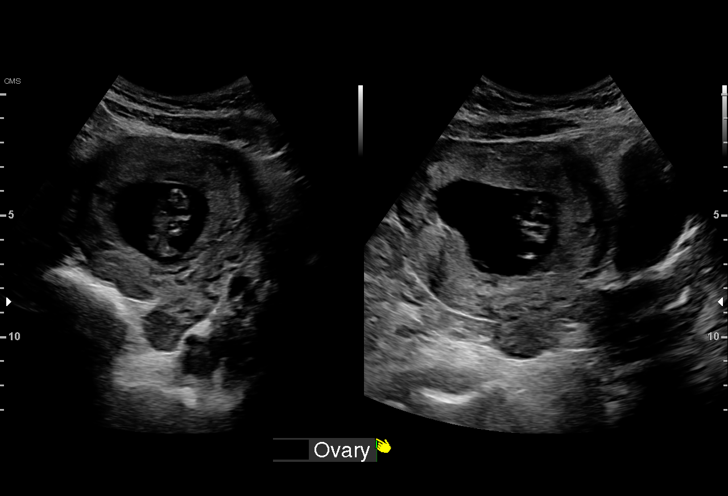
[im 10/18]
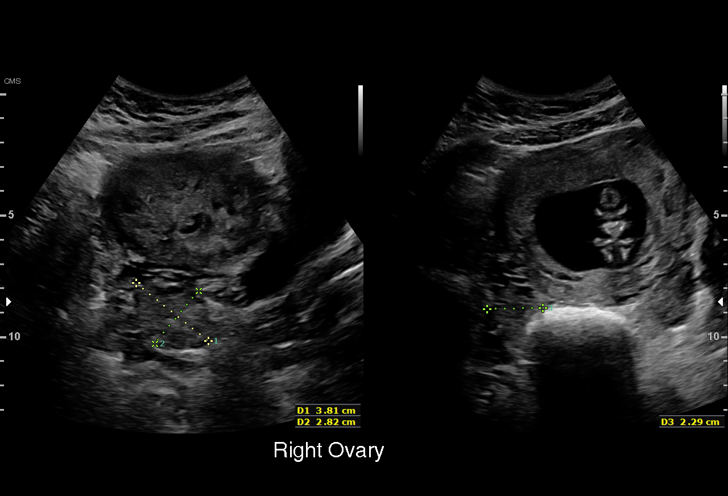
[im 11/18]
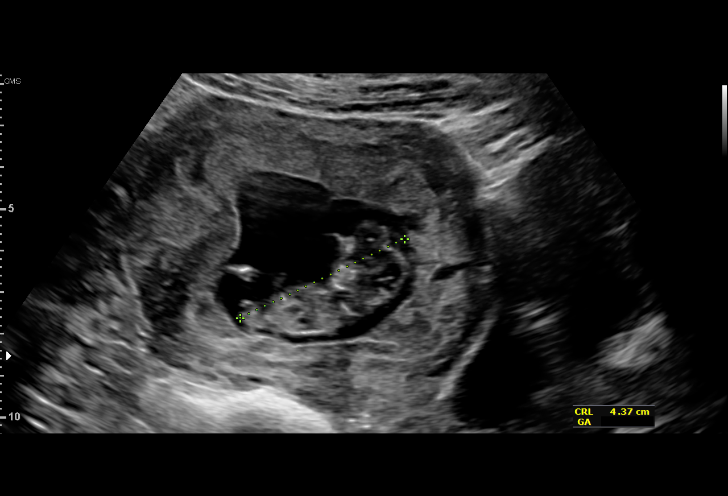
[im 12/18]
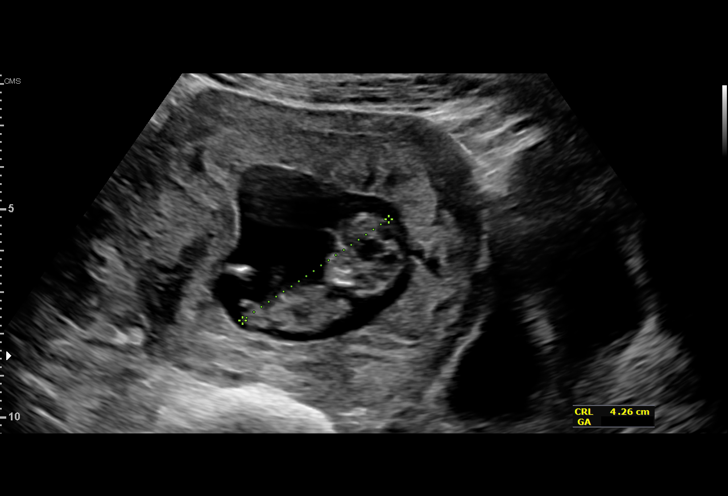
[im 13/18]
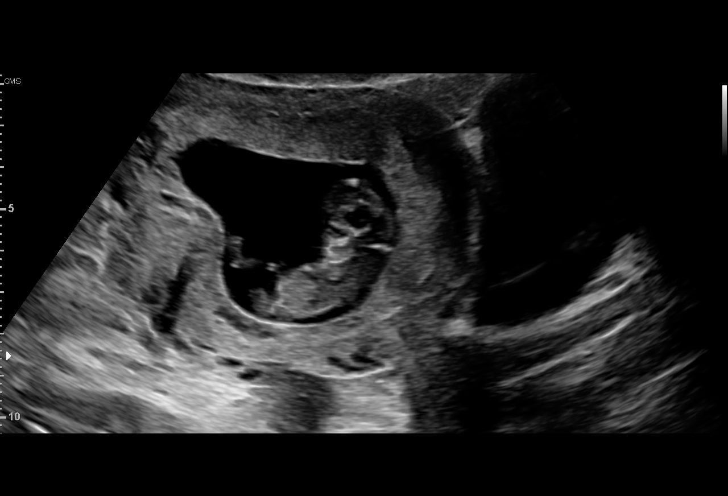
[im 14/18]
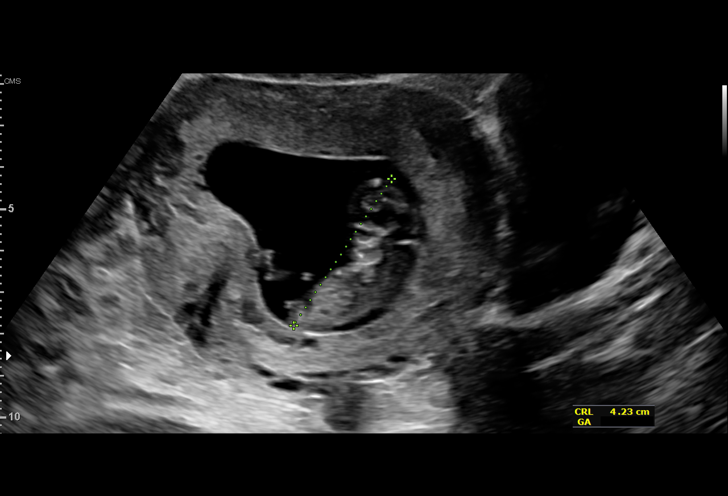
[im 16/18]
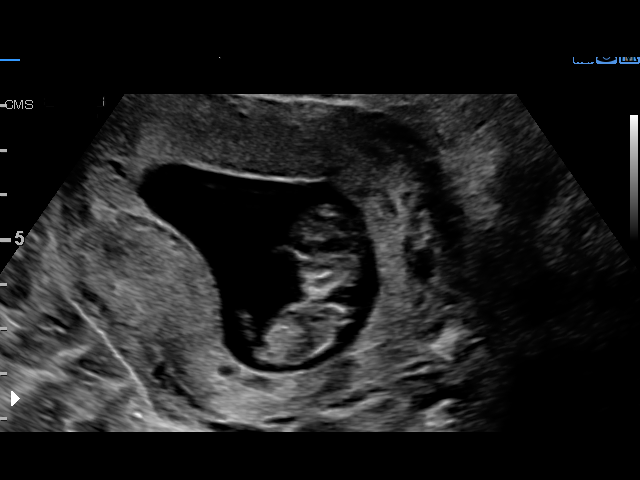
[im 17/18]
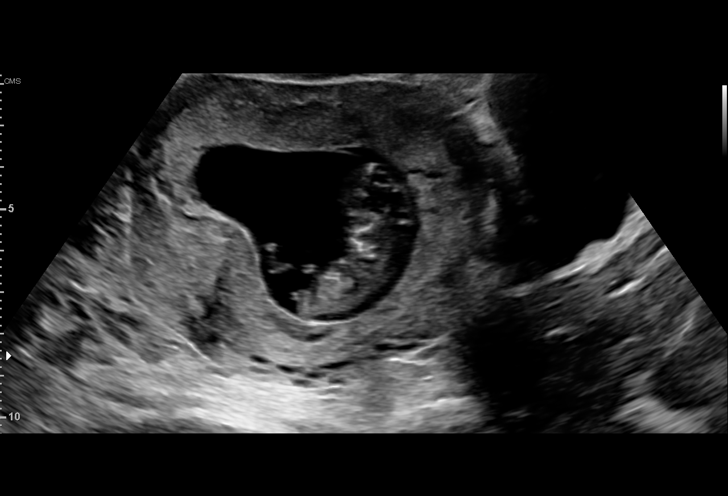
[im 18/18]
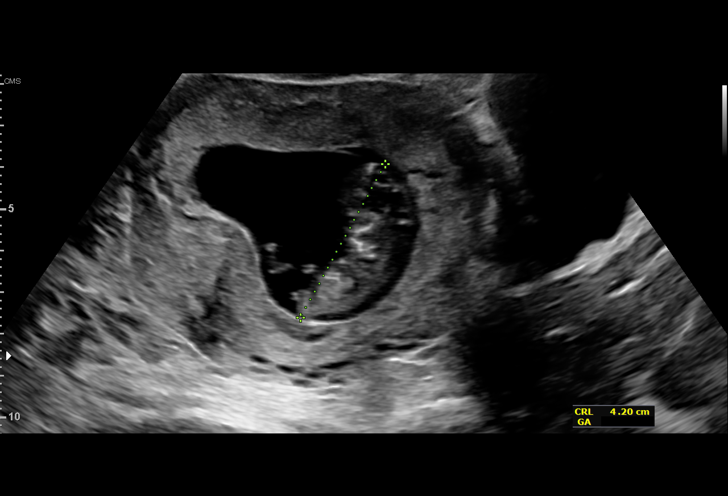

[15 of 18 positions shown; findings below may reference images not displayed]

FINDINGS: Intrauterine gestational sac: Single

Yolk sac:  Not visualized

Embryo:  Visualized

Cardiac Activity: Visualized

Heart Rate: 164  bpm

CRL:  4.23 cm 11 w   1 d                  US EDC: 05/29/2016

Subchorionic hemorrhage:  None visualized.

Maternal uterus/adnexae: Unremarkable.
IMPRESSION: Single viable injury pregnancy at 11 weeks 1 day.

## 2017-10-02 ENCOUNTER — Telehealth: Payer: Self-pay | Admitting: Family Medicine

## 2017-10-02 NOTE — Telephone Encounter (Signed)
Called pt in regards to health maintenance check up but the number was not working.

## 2021-06-08 ENCOUNTER — Encounter: Payer: Self-pay | Admitting: *Deleted
